# Patient Record
Sex: Male | Born: 1960 | Race: White | Hispanic: Yes | Marital: Married | Smoking: Never smoker
Health system: Southern US, Community
[De-identification: ages and names within clinical notes are randomized; demographics above are authoritative.]

## PROBLEM LIST (undated history)

## (undated) DIAGNOSIS — Z9889 Other specified postprocedural states: Secondary | ICD-10-CM

## (undated) DIAGNOSIS — E785 Hyperlipidemia, unspecified: Secondary | ICD-10-CM

## (undated) DIAGNOSIS — I10 Essential (primary) hypertension: Secondary | ICD-10-CM

## (undated) DIAGNOSIS — K219 Gastro-esophageal reflux disease without esophagitis: Secondary | ICD-10-CM

## (undated) HISTORY — DX: Hyperlipidemia, unspecified: E78.5

## (undated) HISTORY — PX: ESOPHAGEAL DILATION: SHX303

---

## 2007-01-02 ENCOUNTER — Encounter: Admission: RE | Admit: 2007-01-02 | Discharge: 2007-01-02 | Payer: Self-pay | Admitting: Gastroenterology

## 2007-02-10 ENCOUNTER — Observation Stay (HOSPITAL_COMMUNITY): Admission: EM | Admit: 2007-02-10 | Discharge: 2007-02-11 | Payer: Self-pay | Admitting: Emergency Medicine

## 2007-02-19 ENCOUNTER — Ambulatory Visit: Payer: Self-pay | Admitting: Gastroenterology

## 2007-02-26 ENCOUNTER — Ambulatory Visit (HOSPITAL_COMMUNITY): Admission: RE | Admit: 2007-02-26 | Discharge: 2007-02-26 | Payer: Self-pay | Admitting: Gastroenterology

## 2007-04-11 ENCOUNTER — Observation Stay (HOSPITAL_COMMUNITY): Admission: RE | Admit: 2007-04-11 | Discharge: 2007-04-13 | Payer: Self-pay | Admitting: Surgery

## 2007-04-11 DIAGNOSIS — K220 Achalasia of cardia: Secondary | ICD-10-CM

## 2007-04-11 HISTORY — DX: Achalasia of cardia: K22.0

## 2007-04-11 HISTORY — PX: LAPAROSCOPIC HELLER MYOTOMY: SUR780

## 2007-04-22 ENCOUNTER — Ambulatory Visit (HOSPITAL_COMMUNITY): Admission: RE | Admit: 2007-04-22 | Discharge: 2007-04-22 | Payer: Self-pay | Admitting: General Surgery

## 2007-05-23 ENCOUNTER — Ambulatory Visit (HOSPITAL_COMMUNITY): Admission: RE | Admit: 2007-05-23 | Discharge: 2007-05-23 | Payer: Self-pay | Admitting: Surgery

## 2007-10-21 ENCOUNTER — Encounter: Admission: RE | Admit: 2007-10-21 | Discharge: 2007-10-21 | Payer: Self-pay | Admitting: Gastroenterology

## 2008-09-01 ENCOUNTER — Emergency Department (HOSPITAL_COMMUNITY): Admission: EM | Admit: 2008-09-01 | Discharge: 2008-09-01 | Payer: Self-pay | Admitting: Emergency Medicine

## 2010-08-09 LAB — DIFFERENTIAL
Basophils Absolute: 0 10*3/uL (ref 0.0–0.1)
Basophils Relative: 1 % (ref 0–1)
Eosinophils Absolute: 0.3 10*3/uL (ref 0.0–0.7)
Eosinophils Relative: 5 % (ref 0–5)
Lymphocytes Relative: 23 % (ref 12–46)
Lymphs Abs: 1.7 10*3/uL (ref 0.7–4.0)
Monocytes Absolute: 0.7 10*3/uL (ref 0.1–1.0)
Monocytes Relative: 9 % (ref 3–12)
Neutro Abs: 4.7 10*3/uL (ref 1.7–7.7)
Neutrophils Relative %: 63 % (ref 43–77)

## 2010-08-09 LAB — CBC
HCT: 46.5 % (ref 39.0–52.0)
Hemoglobin: 16 g/dL (ref 13.0–17.0)
MCHC: 34.5 g/dL (ref 30.0–36.0)
MCV: 92.1 fL (ref 78.0–100.0)
Platelets: 232 10*3/uL (ref 150–400)
RBC: 5.05 MIL/uL (ref 4.22–5.81)
RDW: 13 % (ref 11.5–15.5)
WBC: 7.5 10*3/uL (ref 4.0–10.5)

## 2010-08-09 LAB — COMPREHENSIVE METABOLIC PANEL
ALT: 27 U/L (ref 0–53)
AST: 27 U/L (ref 0–37)
Albumin: 3.9 g/dL (ref 3.5–5.2)
Alkaline Phosphatase: 74 U/L (ref 39–117)
BUN: 18 mg/dL (ref 6–23)
CO2: 25 mEq/L (ref 19–32)
Calcium: 9.3 mg/dL (ref 8.4–10.5)
Chloride: 105 mEq/L (ref 96–112)
Creatinine, Ser: 1.02 mg/dL (ref 0.4–1.5)
GFR calc Af Amer: >60 mL/min (ref >60)
GFR calc non Af Amer: >60 mL/min (ref >60)
Glucose, Bld: 128 mg/dL — ABNORMAL HIGH (ref 70–99)
Potassium: 3.6 mEq/L (ref 3.5–5.1)
Sodium: 136 mEq/L (ref 135–145)
Total Bilirubin: 0.8 mg/dL (ref 0.3–1.2)
Total Protein: 6.8 g/dL (ref 6.0–8.3)

## 2010-08-09 LAB — URINALYSIS, ROUTINE W REFLEX MICROSCOPIC
Bilirubin Urine: NEGATIVE
Glucose, UA: NEGATIVE mg/dL
Hgb urine dipstick: NEGATIVE
Ketones, ur: NEGATIVE mg/dL
Nitrite: NEGATIVE
Protein, ur: NEGATIVE mg/dL
Specific Gravity, Urine: 1.023 (ref 1.005–1.030)
Urobilinogen, UA: 1 mg/dL (ref 0.0–1.0)
pH: 6.5 (ref 5.0–8.0)

## 2010-08-27 ENCOUNTER — Emergency Department (INDEPENDENT_AMBULATORY_CARE_PROVIDER_SITE_OTHER): Payer: 59

## 2010-08-27 ENCOUNTER — Emergency Department (HOSPITAL_BASED_OUTPATIENT_CLINIC_OR_DEPARTMENT_OTHER)
Admission: EM | Admit: 2010-08-27 | Discharge: 2010-08-27 | Disposition: A | Discharge status: Nursing Home (Includes ICF) | Payer: Self-pay | Attending: Emergency Medicine | Admitting: Emergency Medicine

## 2010-08-27 ENCOUNTER — Inpatient Hospital Stay (HOSPITAL_COMMUNITY)
Admission: RE | Admit: 2010-08-27 | Discharge: 2010-08-29 | DRG: 313 | Disposition: A | Discharge status: Home / Self Care | Payer: Self-pay | Source: Other Acute Inpatient Hospital | Attending: Internal Medicine | Admitting: Internal Medicine

## 2010-08-27 DIAGNOSIS — K220 Achalasia of cardia: Secondary | ICD-10-CM | POA: Diagnosis present

## 2010-08-27 DIAGNOSIS — I10 Essential (primary) hypertension: Secondary | ICD-10-CM | POA: Diagnosis present

## 2010-08-27 DIAGNOSIS — R0789 Other chest pain: Principal | ICD-10-CM | POA: Diagnosis present

## 2010-08-27 DIAGNOSIS — K219 Gastro-esophageal reflux disease without esophagitis: Secondary | ICD-10-CM | POA: Insufficient documentation

## 2010-08-27 DIAGNOSIS — R079 Chest pain, unspecified: Secondary | ICD-10-CM

## 2010-08-27 LAB — DIFFERENTIAL
Basophils Absolute: 0.1 10*3/uL (ref 0.0–0.1)
Basophils Relative: 1 % (ref 0–1)
Lymphocytes Relative: 21 % (ref 12–46)
Monocytes Absolute: 0.8 10*3/uL (ref 0.1–1.0)

## 2010-08-27 LAB — CBC
Hemoglobin: 16 g/dL (ref 13.0–17.0)
MCH: 31.2 pg (ref 26.0–34.0)
MCHC: 36.4 g/dL — ABNORMAL HIGH (ref 30.0–36.0)
MCV: 85.8 fL (ref 78.0–100.0)
RBC: 5.13 MIL/uL (ref 4.22–5.81)
RDW: 12.8 % (ref 11.5–15.5)

## 2010-08-27 LAB — POCT CARDIAC MARKERS
CKMB, poc: 5.1 ng/mL (ref 1.0–8.0)
CKMB, poc: 4 ng/mL (ref 1.0–8.0)
Myoglobin, poc: 144 ng/mL (ref 12–200)
Myoglobin, poc: 115 ng/mL (ref 12–200)

## 2010-08-27 LAB — BASIC METABOLIC PANEL
Chloride: 103 mEq/L (ref 96–112)
GFR calc Af Amer: >60 mL/min (ref >60)
GFR calc non Af Amer: >60 mL/min (ref >60)
Glucose, Bld: 94 mg/dL (ref 70–99)
Potassium: 3.7 mEq/L (ref 3.5–5.1)

## 2010-08-27 LAB — D-DIMER, QUANTITATIVE: D-Dimer, Quant: <0.22 ug/mL-FEU (ref 0.00–0.48)

## 2010-08-28 DIAGNOSIS — R072 Precordial pain: Secondary | ICD-10-CM

## 2010-08-28 LAB — LIPID PANEL
Cholesterol: 147 mg/dL (ref 0–200)
HDL: 27 mg/dL — ABNORMAL LOW (ref >39)
LDL Cholesterol: 89 mg/dL (ref 0–99)
Total CHOL/HDL Ratio: 5.4 RATIO
Triglycerides: 154 mg/dL — ABNORMAL HIGH (ref <150)
VLDL: 31 mg/dL (ref 0–40)

## 2010-08-28 LAB — BASIC METABOLIC PANEL
BUN: 12 mg/dL (ref 6–23)
CO2: 27 mEq/L (ref 19–32)
Calcium: 9.1 mg/dL (ref 8.4–10.5)
Chloride: 105 mEq/L (ref 96–112)
Creatinine, Ser: 1.21 mg/dL (ref 0.4–1.5)
GFR calc Af Amer: >60 mL/min (ref >60)
GFR calc non Af Amer: >60 mL/min (ref >60)
Glucose, Bld: 95 mg/dL (ref 70–99)
Potassium: 3.4 mEq/L — ABNORMAL LOW (ref 3.5–5.1)
Sodium: 137 mEq/L (ref 135–145)

## 2010-08-28 LAB — CARDIAC PANEL(CRET KIN+CKTOT+MB+TROPI)
CK, MB: 6.5 ng/mL (ref 0.3–4.0)
CK, MB: 5 ng/mL — ABNORMAL HIGH (ref 0.3–4.0)
CK, MB: 4.1 ng/mL — ABNORMAL HIGH (ref 0.3–4.0)
Relative Index: 0.9 (ref 0.0–2.5)
Relative Index: 0.8 (ref 0.0–2.5)
Relative Index: 0.8 (ref 0.0–2.5)
Total CK: 738 U/L — ABNORMAL HIGH (ref 7–232)
Total CK: 612 U/L — ABNORMAL HIGH (ref 7–232)
Total CK: 519 U/L — ABNORMAL HIGH (ref 7–232)
Troponin I: 0.02 ng/mL (ref 0.00–0.06)
Troponin I: 0.02 ng/mL (ref 0.00–0.06)
Troponin I: 0.02 ng/mL (ref 0.00–0.06)

## 2010-08-28 LAB — D-DIMER, QUANTITATIVE: D-Dimer, Quant: <0.22 ug/mL-FEU (ref 0.00–0.48)

## 2010-08-28 LAB — AMYLASE: Amylase: 71 U/L (ref 0–105)

## 2010-08-28 LAB — LIPASE, BLOOD: Lipase: 45 U/L (ref 11–59)

## 2010-08-28 LAB — RAPID URINE DRUG SCREEN, HOSP PERFORMED: Cocaine: NOT DETECTED

## 2010-08-28 LAB — AST: AST: 41 U/L — ABNORMAL HIGH (ref 0–37)

## 2010-08-28 LAB — ALT: ALT: 32 U/L (ref 0–53)

## 2010-09-01 NOTE — Discharge Summary (Signed)
[name], [name]:  [id number]  MEDICAL RECORD NO.:  [id number]           PATIENT TYPE:  I  LOCATION:  3733                         FACILITY:  [facility]  PHYSICIAN:  Jeoffrey Massed, MD    DATE OF BIRTH:  02/09/1961  DATE OF ADMISSION:  08/27/2010 DATE OF DISCHARGE:                        DISCHARGE SUMMARY - REFERRING   PRIMARY CARE PRACTITIONER:  Will now be at the [facility].  PRIMARY DISCHARGE DIAGNOSIS:  Atypical chest pain.  SECONDARY DISCHARGE DIAGNOSES: 1. History of gastroesophageal reflux disease. 2. Hypertension. 3. Achalasia.  DISCHARGE MEDICATIONS: 1. Aspirin 81 mg 1 tablet daily. 2. Hydrochlorothiazide 12.5 mg 1 capsule p.o. daily. 3. Multivitamins 1 tablet p.o. daily. 4. Omeprazole 20 mg 1 capsule p.o. daily.  CONSULTATIONS:  None.  BRIEF HISTORY OF PRESENT ILLNESS:  The patient is a 50 year old Hispanic male with the above-noted medical problems who was brought into the hospital on April 28 with predominant left-sided along with epigastric pain.  He was then admitted to the hospitalist service for further evaluation and treatment.  For further details, please see the history and physical that was dictated by Dr. Wolfgang Phoenix on admission.  A 2-D echocardiogram done on 28 August 2010 showed an EF around 60-65%. Systolic function was normal.  PERTINENT LABORATORY DATA: 1. Troponins were cycled, and these were negative. 2. TSH is 2.505. 3. D-dimers was less than 0.22. 4. Urine drug screen was negative. 5. Amylase was 71. 6. Lipase was 45. 7. Urine drug screen was negative as well. 8. The patient's peak CK was 738 and last CK was 519.  PERTINENT RADIOLOGICAL STUDIES: 1. Portable chest x-ray shows no acute cardiopulmonary findings. 2. Chest pain.  The patient was admitted with left-sided as well as     substernal epigastric pain.  The patient had a history of     hypertension.  He has no other risk factors apart from this.   He     was admitted.  Cardiac enzymes were cycled.  Although he had CK and     CK-MB elevation, his troponins were negative.  At this time, this     chest pain is felt to be atypical.  The patient has a history of     achalasia and is actually status post partial posterior     fundoplication and also has a history of balloon dilatation done in     2009 and his pain is probably felt to be secondary to GI issues.     In any event, a 2-D echocardiogram was done which is essentially     normal.  [facility] has been contacted, and we will contact     the patient for an outpatient stress test. 3. Gastroesophageal reflux disease.  He is to continue taking     omeprazole. 4. Hypertension.  This is stable.  He is to continue taking     hydrochlorothiazide.  DISPOSITION:  The patient will be discharged home.  FOLLOW-UP INSTRUCTIONS: 1. The patient does not have a primary care practitioner and claims     that he will follow up at the [facility].  Contact  information including telephone number has been given to the     patient. 2. [facility] will contact the patient for an outpatient     stress test.  Total time spent 45 minutes.     Jeoffrey Massed, MD     SG/MEDQ  D:  08/29/2010  T:  08/29/2010  Job:  [id number]  cc:   [facility]  Electronically Signed by Jeoffrey Massed  on 09/01/2010 03:41:56 PM

## 2010-09-03 LAB — CULTURE, BLOOD (ROUTINE X 2)
Culture  Setup Time: 201204291143
Culture  Setup Time: 201204291143
Culture: NO GROWTH
Culture: NO GROWTH

## 2010-09-05 ENCOUNTER — Encounter: Payer: Self-pay | Admitting: Internal Medicine

## 2010-09-13 NOTE — Op Note (Signed)
NAMEJERRITT, CARDOZA NO.:  [id number]   MEDICAL RECORD NO.:  [id number]          PATIENT TYPE:  AMB   LOCATION:  ENDO                         FACILITY:  [facility]   PHYSICIAN:  Ardeth Sportsman, MD     DATE OF BIRTH:  04-17-1961   DATE OF PROCEDURE:  05/23/2007  DATE OF DISCHARGE:                               OPERATIVE REPORT   PRIMARY CARE PHYSICIAN:  Fleet Contras, MD.   GASTROENTEROLOGIST:  Jeani Hawking, MD.   SURGEON:  Karie Soda, MD.   PREOPERATIVE DIAGNOSES:  1. 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  2. Partial posterior fundoplication with persistent dysphagia.   POSTOPERATIVE DIAGNOSES:  1. Achalasia 6 weeks status post cardiomyopathy.  2. Mild gastritis.  3. Retained food in stomach consistent with delayed gastric emptying.  4. Mild tightness in distal esophagus but no definite stricture.   PROCEDURE PERFORMED:  1. Esophagogastroduodenoscopy.  2. Balloon dilatation from 15 to 18 mm (up to 54 [place]) x2 minutes.  3. Injection of Kenalog-40 x4 quadrants in distal esophagus.   ANESTHESIA:  1. Intermittent fentanyl and midazolam.  2. Cetacaine oral spray.   SPECIMENS:  None.   DRAINS:  None.   ESTIMATED BLOOD LOSS:  Less than 5 mL.   COMPLICATIONS:  None apparent.   INDICATIONS:  Senor Wandel is a 50 year old male with classic symptoms  and diagnosis of achalasia.  He is 6 weeks status post laparoscopic  cardiomyopathy and partial fundoplication but he had persistent  dysphagia.  His initial swallow study was moderately tight but it seemed  to improve.  I placed him on some oral steroids.  He was still  complaining of persistent dysphagia beyond pureed foods.  The patient  had anxiety.  A recommendation made for diagnostic endoscopy with  probable dilatation and injection of steroids to see if that would  improve things.   Risks such as stroke, heart attack, deep venous thrombosis, pulmonary  embolism and death were discussed.  Risks such as perforation, need for  surgery, wound infection, abscess, injury to other organs, prolonged  pain, persistent dysphagia, and other risks were discussed as well.  Questions were answered & he agreed to proceed.   OPERATIVE FINDINGS:  He had a dilated esophagus but no retained food  with only minimal froth.  From 42 to 44 cm was snug but I will not say  severely tight.  There was no definite esophageal stricture or mass.  He  had a large amount of retained food in his antrum with some mild  gastritis.  Pylorus and duodenum appeared normal.   DESCRIPTION OF PROCEDURE:  Informed consent was confirmed.  Patient  received oral Cetacaine and was positioned left lateral decubitus.  He  had intermittent midazolam and fentanyl for a total of 150 mcg of  fentanyl and 6 mg of Versed.  An oral bite block was placed.  Endoscope  was able to be passed into the oropharynx and hypopharynx which appeared  to be normal.  Scope passed down with voluntary swallow into a  moderately dilated esophagus but no significant esophagitis.  Encountered down at about 42 cm had a tighter area but not significant  stricture.  Encountered a mild dilated segment around 2 cm and then a  high pressure zone at 44 cm.  I immediately encountered a large volume  of retained food within the stomach.  There is some mild diffuse  gastritis.  The pylorus appeared to be normal.   Retroflexion view revealed, I would say, about a type 3 valve consistent  with partial fundoplication.  It did not seem particularly scarred or  that abnormal.  It was a little challenging to see since he had a lot of  retained food that persisted despite numerous irrigation and aspiration  attempts.   Scope was placed straight and a balloon dilator was advanced.  Scope was  brought back down to the level of the LES and the balloon was dilated to  15 mm x30 seconds, then 16.5 mm x30 seconds, and then 18  mm for about a  minute and a half.  At that point the balloon migrated distally in the  stomach so we repositioned and again did the same procedure, dilated at  18mm for 2 minutes.  The balloon was brought down.  He tolerated the  procedure well.  He was not tachycardic.  I went ahead and used a  sclerotherapy needle to inject a total of 4 mL of Kenalog 40 using 1 mL  injections in four quadrants down at the level of 44 cm.  The scope was  readvanced, and the stomach and wrap was reinspected.  There was no  evidence of any bleeding or abnormalities.  Excess gas was aspirated,  the scope was aspirated upon withdrawal.   Postoperatively, he was complaining of some mild chest discomfort but no  severe pain.  We obtained an upper GI Gastrografin that showed no leak  and pretty good movement of contrast across the wrap.  I discussed the  findings with his wife in detail.  Questions were answered & she  expressed understanding & appreciation      Ardeth Sportsman, MD  Electronically Signed     SCG/MEDQ  D:  05/23/2007  T:  05/23/2007  Job:  [id number]

## 2010-09-13 NOTE — H&P (Signed)
[name], [name]:  [id number]   MEDICAL RECORD NO.:  [id number]          PATIENT TYPE:  EMS   LOCATION:  MAJO                         FACILITY:  [facility]   PHYSICIAN:  Herbie Saxon, MDDATE OF BIRTH:  1961/02/06   DATE OF ADMISSION:  02/10/2007  DATE OF DISCHARGE:                              HISTORY & PHYSICAL   PRIMARY CARE PHYSICIAN:  Unassigned   GASTROENTEROLOGIST:  Dr. Elnoria Howard   PRESENTING COMPLAINT:  Chest pain one-day duration.   HISTORY OF PRESENTING COMPLAINT:  This is a 50 year old Hispanic male  who was diagnosed with hypertension four months ago.  He has a history  of achalasia, gastroesophageal reflux disease and has been seen as an  outpatient by Dr. Elnoria Howard.  Patient is complaining of sudden-onset chest  pain which woke him up from sleep at 10:00 p.m. last night.  The chest  pain is 7 to 8/10, pressure like, radiating to the back, aggravated by  food, relieved by lying still.  No shortness of breath, no palpitations,  no pedal swelling.  He has dizziness.  No diaphoresis, no syncopal  episode.  He has nausea plus two episodes of vomiting.  No frank  hematemesis, but he notices on-and-off hematochezia the last three days.  No diarrhea, no constipation, no jaundice.  There is no fever or skin  rash.  No joint swelling.   PAST SURGICAL HISTORY:  Nil of note.   PAST MEDICAL HISTORY:  1. Hypertension.  2. GERD.  3. Achalasia.   SOCIAL HISTORY:  Patient is married, has four children, lives at home.  Past history of cocaine, alcohol abuse; he says he quit about several  years ago drinking alcohol and he quit doing cocaine about 15 years ago;  he quit smoking one pack per day a year ago; he used to smoke for about  eight years.   FAMILY HISTORY:  Cousin cancer of the stomach; grandfather stroke and  cancer of the face.   No known drug allergies.   MEDICATIONS:  Lisinopril one tablet daily, Prevacid one tablet daily;  doses  unknown.   REVIEW OF SYSTEMS:  The 12-systems reviewed, pertinent positives as  above.   PHYSICAL EXAMINATION:  GENERAL:  He is a middle-age man not in acute  respiratory distress.  VITAL SIGNS:  Temperature 97, pulse 71, respiratory rate 18, blood  pressure 106/66.  HEENT:  Pupils equal and reactive to light and accommodation.  Neck is  supple.  Extraocular muscles are intact.  Head is atraumatic,  normocephalic.  Mucous membranes are moist.  There is no elevated JVD or  carotid bruit.  No thyromegaly.  No submandibular lymph nodes.  CHEST:  Clinically clear.  Reduced breath sounds at the bases.  ABDOMEN:  Mild epigastric tenderness.  There is no organomegaly.  No  rebound.  No guarding.  Bowel sounds are normoactive.  Inguinal orifices are intact.  Peripheral pulses present.  No pedal  edema.  NEURO:  He is alert and oriented x3.  Power is 5 globally.  Cranial  nerves and sensory system normal.   The lab  tests show a WBC of 8.8, hematocrit 40, platelet count is 271.  The troponin is less than 0.05.  CK-MB 1.5  Chemistry shows a sodium of  135, potassium 3.4, chloride 102, bicarbonate 27, glucose 108, BUN 13,  creatinine 1.1.  AST is 22, ALT is 22.  Lipase 43.   Chest x-ray shows bronchitic changes with minimal bibasal atelectasis.   No ST-T abnormalities on EKG.   ASSESSMENT:  1. Chest pain.  2. Rule out gastrointestinal bleed.  3. History of achalasia.  4. Gastroesophageal reflux disease.  5. Hypertension, stable.  6. Mild hypokalemia.   Patient is to be admitted to a telemetry bed and be kept n.p.o. until  gastroenterology evaluate.  I will cycle his cardiac enzymes q.8 hours  x3 with EKG q.8 hours x3, place him on Protonix 40 mg IV q.12 hourly, CP  boots for DVT prophylaxis, Phenergan 12.5 mg IV q.8 hours p.r.n.,  nitroglycerin 0.4 mg sublingual p.r.n., Dilaudid 0.5 to 1 mg IV q.4  hours p.r.n., morphine 2 mg IV q.6 hours p.r.n., O2 two liters nasal  cannula to keep  pulse ox greater than 90.  Orthostatic blood pressure  checked q.6 hourly and monitor the H&H q.8 hourly for the first 24 hours  and reevaluate.  Hemoccult x3.  Type and cross, match four units of  packed red blood cells and if hematocrit drops below 25, transfuse two  units packed red blood cells with 40 mg IV Lasix or lisinopril 2.5 mg  daily, hold if blood pressure is less than 100/60, albuterol and  Atrovent one unit dose q.6 hours p.r.n.  Get D-dimer review.  Hold  aspirin until Hemoccult is checked to be negative x3.  Gastroenterology  evaluation in the morning, possibly repeat endoscopy.  Get a 2-D  echocardiogram given he has not had one in the last six months.  He is a  full code.  I will place him on Reglan 10 mg IV q.6 hours p.r.n. for  nausea.      Herbie Saxon, MD  Electronically Signed     MIO/MEDQ  D:  02/10/2007  T:  02/10/2007  Job:  [id number]

## 2010-09-13 NOTE — Op Note (Signed)
[name]:  [id number]   MEDICAL RECORD NO.:  [id number]          PATIENT TYPE:  INP   LOCATION:  1532                         FACILITY:  [facility]   PHYSICIAN:  Bradley Sportsman, MD     DATE OF BIRTH:  November 11, 1960   DATE OF PROCEDURE:  04/11/2007  DATE OF DISCHARGE:                               OPERATIVE REPORT   SURGEON:  Bradley Sportsman, MD.   ASSISTANT:  [name], M.D.   PRIMARY CARE PHYSICIAN:  [name], M.D.   GASTROENTEROLOGIST:  [name]. Bradley Howard, MD.   PREOPERATIVE DIAGNOSIS:  Achalasia.   POSTOPERATIVE DIAGNOSIS:  Achalasia.   PROCEDURES PERFORMED:  1. Laparooscopic Heller esophagocardiomyotomy.  2. Laparoscopic Toupet (270 degree, partial posterior) fundoplication.   ANESTHESIA:  1. General anesthesia.  2. Local anesthetic with field block around all port sites.   SPECIMENS:  None.   DRAINS:  A 15 [place] Blake drain rests in the splenic fossa and over the  myotomy.   ESTIMATED BLOOD LOSS:  100 mL.   COMPLICATIONS:  Small anterior tear at the esophagogastric junction  repaired with 3-0 PDS stitch, Tisseel and drain.   INDICATIONS FOR PROCEDURE:  [name] is a 50 year old gentleman who  has noted worsening episodes of dysphagia for the past six years and he  has undergone evaluation by Dr. Jeani Hawking who performed endoscopy  which was suspicious for achalasia but no evidence of stricture.  He  also had an esophagogram which was kind of suspicious for this as well.  He underwent esophageal manometry which showed noted aperistalsis of  contractions and amplitudes in the 12-20 mm range which is far below the  normal range of 30-108.  This work-up was consistent with achalasia.  Options were discussed and recommendation was made for a laparoscopic  myotomy with partial fundoplication.   TECHNIQUE:  Laparoscopic esophagocardiomyotomy (Heller myotomy) with  partial posterior fundoplication was discussed.  Risks  such as stroke,  heart attack, deep venous thrombosis, pulmonary embolism and death were  discussed.  Risks such as bleeding, need for transfusion, wound  infection, abscess, injury to other organs and prolonged pain, leak  requiring reoperation, prolonged drainage or TPN and other risks were  discussed.  Questions answered and she agreed to proceed.   FINDINGS:  He had no evidence of any paraesophageal hernia.  He had a  strictured distal esophagus with tight bands consistent with achalasia  with proximal esophageal dilation.  The esophageal manometry went  smoothly but during a transition over to the esophagogastric junction,  there was a tear about 6 mm in size that was apparent initially and then  opened up again to around 15 mm in size and was repaired with 3-0 PDS  interrupted stitch to good result and then patched with Tisseel.  See  below.   DESCRIPTION OF PROCEDURE:  Informed consent was confirmed.  Patient had  received IV antibiotics prior to surgery.  He had sequential compression  devices active during the entire case.  He was positioned supine on a  split leg table.  He underwent  general anesthesia without any  difficulty.  A Foley catheter was sterilely placed.  His abdomen was  prepped and draped in sterile fashion.  Entering in the abdomen, the  patient in steep reverse Trendelenburg and left side up, we placed a 5  mm port in the left upper quadrant and paramedian region half way  between the umbilicus and the xiphoid in the medial clavicular line  using optical entry technique to place a 5 mm port.  Capnoperitoneum 15  mmHg provided good abdominal insufflation.  Under direct visualization a  5 mm port was placed in the right upper quadrant and a 10 mm port was  placed in the left subcostal region and a 5 mm port was placed in the  right subxiphoid region.  Another 5 mm port was placed in the left  subxiphoid region, removed and the Nathanson rigid liver retractor  was  passed under direct visualization and used to lift the thick lateral  sector of the liver anteriorly.   The hepatogastric ligament was identified with the stomach on axial  tension.  This was ligated using ultrasonic dissection.  This helped to  identify the right crus.  Window was made between the right crus and the  esophagus anteriorly.  However, posteriorly the tissues were a little  bit fibrosed.  Therefore, I decided to take approach from the left side.   The short gastrics were ligated about a third of the way down the  greater curvature and followed cephalad over the proximal greater  curvature and the cardia by flipping the posterior part of the stomach  anteriorly and towards the right and holding the greater omentum towards  the left using ultrasonic dissection.  There were some close attachments  to the spleen but these were carefully taken out.  I would say the  superior third of the spleen became ischemic, consistent with probable  segmental preferential blood supply from the short gastrics.  With  further rolling the stomach over, I could identify the left crus and the  window was made between the esophagus and the medial border of the left  crus.  the phrenoesophageal attachments on the left side were freed off.  Further dissection was done anteriorly to the anterior part of the crus  to help meet up with the right anterior part of the crus.  Then we  created a window posteriorly as well.  The stomach and esophagus were  reflected towards the left to better expose the posterior mediastinum  from the right side.  With this, I was able to free off the right crus  off the right front esophageal attachments off the medical right crus,  carefully using blunt and controlled Harmonic dissection.   Type 2 medial dissection was done to help get enough intra-abdominal  esophageal length and come up to go up above the obviously narrowed  distal esophagus to a more dilated  esophagus more proximally up in the  mediastinum.   The anterior epiphrenic pad was free off anteriorly, taking care to  avoid injury to the anterior vagus nerve.  Note that the anterior and  posterior vagus nerves were identified and preserved during the entire  case.  The anterior vagus nerve along with the phrenic pad was elevated  off the esophagus and reflected towards the right and was freed off the  esophagus and mobilized off the anterior esophagus about 10 cm  proximally.   Attention was turned toward the myotomy.  Myotomy was started at  the  distal esophagus bluntly going through the longitudinal fibers and  identified and ligating the circular fibers of the esophagus using cold  scissors.  Some careful blunt dissection was done using the sucker as  well as [place] to help free the fibers off the mucosa and take them  with sharp scissors.  Myotomy was carried up about 7 cm proximally from  the esophagogastric junction to a more dilated esophagus and thinner  muscle layers.  Dissection was carried down onto the stomach, however,  in doing this, there was a very fibrotic region in the esophagogastric  junction.  In trying to carefully free this area, got into a bloody  region.  Focused point cautery helped provide good hemostasis.  Sharp  scissors helped to get through some of the layers.  Carefully blunt  dissection was used to try and free the tissues of the mucosa but in  doing this, there was a tear of the mucosa.  It was immediately  identified and controlled using 3-0 PDS interrupted stitches x2.  Further myotomy was attempted but in trying to free off, there was a  tear continued a little more cephalad to where the prior repair was.  This was controlled using 3-0 PDS running stitch to good result.   Based on this tear, I decided to redirect the myotomy more laterally on  the stomach so a site was chosen 2 cm more lateral a little closer to  the angle of His.  I was  able to sharply get in through the stomach's  oblique muscular fibrous layers to see the layer of the gastric mucosa  and this was preserved.  Myotomy was carried up more cephalad.  In doing  this, I felt I got about 2.5 cm from the esophagogastric junction onto  the stomach for an adequate myotomy on the cardia of the stomach.  Copious irrigation and meticulous inspection was done for good  hemostasis and there was no evidence of any new tears.  The repairs were  intact.   Attention was turned towards fundoplication.  The cardia of the stomach  was brought through the retroesophageal window and brought over on the  right side to help develop the right side of the wrap.  The remaining  left side of the stomach most cephalad greater curvature was held and a  classic shoeshine maneuver was used to make sure the wrap laid well with  no twisting or turning.  The right side of the wrap was grasped and  rotated anteriorly towards the spleen to expose the posterior esophageal  hiatus. The esophageal hiatus was closed down using 0 Ethibond  interrupted stitches, incorporating a bite of the posterior wall of the  wrap, therefore doing a posterior gastropexy x3.  This provided good  reclosing of the esophageal hiatus but avoiding over-tightening.   Interrupted esophagogastric stitches x6 were used, three on each side,  to create the partial posterior wrap.  Stitches were placed three on the  right and three on the left in a mirror image fashion.  Stitches were  placed such that the most cephalad stitch took a bite of the wrap on the  most superior aspect of the wrap at the short gastrics, taking good bite  of the esophageal muscle at the lateral edge of the myotomy, and also  taking a bite of the peritoneal fascial covering at the superior aspect  of the left crus and tied down to create the left most superior stitch.  A similar stitch was placed on the right in a mirror image fashion.  This  provided anterior gastropexies in both sides.  Further interrupted  stitches were carried down more distally onto the esophagus so that  there was a 4.5 cm partial posterior fundoplication wrap.  The stitches  held the edges of the myotomy open so the mucosa lay wide open.  Inspection was done and hemostasis was excellent.  Again, the repairs  were intact and there was no evidence of any other injury.  Copious  irrigation over 2 liters was done with clear return and there was no  evidence of injury or bleeding elsewhere.  Tisseel was placed over the  myotomy and focusing around the repair of the small esophagogastric  tears for a nice thick layer and covering.  A 15 [place] Blake drain was  passed through the right upper quadrant port site with the tip down in  the splenic fossa and coming up over the left side of the wrap, laying  over the anterior part of the myotomy, and coming out underneath the  left lateral sector of the liver over to the right port.  It was secured  to the skin using 2-0 nylon stitch.  Nathanson liver retractor was  removed after direct visualization.  There was no evidence of any injury  to other organs.  The capnoperitoneum was evacuated and ports were  removed.  The left subcostal 5 mm port had fascia slid above the rib and  did not require any more aggressive closure.  Skin was closed using 4-0  Monocryl stitch.  Sterile dressings were applied.  Patient was extubated  and taken to the recovery room in stable condition.   I am about to explain the operative findings to the patient's family.  Postoperative instructions have been discussed with the patient and he  has our postoperative dysphagia diet.  Will do a follow-up swallow study  in the morning and check drain for amylase in the morning to make sure  there is no evidence of postoperative leak.      Bradley Sportsman, MD  Electronically Signed     SCG/MEDQ  D:  04/11/2007  T:  04/12/2007  Job:   [id number]   cc:   [name]. Bradley Howard, MD  Fax: [phone]   [name], M.D.  Fax: [phone]

## 2010-09-13 NOTE — Discharge Summary (Signed)
[name], [name] NO.:  [id number]   MEDICAL RECORD NO.:  [id number]          PATIENT TYPE:  INP   LOCATION:  1532                         FACILITY:  [facility]   PHYSICIAN:  Ardeth Sportsman, MD     DATE OF BIRTH:  05/03/1960   DATE OF ADMISSION:  04/11/2007  DATE OF DISCHARGE:  04/13/2007                               DISCHARGE SUMMARY   PRIMARY CARE PHYSICIAN:  Fleet Contras, M.D.   GASTROENTEROLOGIST:  Jordan Hawks. Elnoria Howard, MD   FINAL DISCHARGE DIAGNOSIS:  Achalasia.   SECONDARY DISCHARGE DIAGNOSES:  1. Hypertension.  2. Pseudo reflux.   PROCEDURES:  Laparoscopic Heller esophagocardiomyotomy with Toupet  (partial 270-degree) fundoplication on April 11, 2007.   SUMMARY OF HOSPITAL COURSE:  [name] is a 50 year old  gentleman with achalasia that was documented by endoscopy,  esophagram,  and monometry.  Dr. Elnoria Howard requested surgical consultation, and after  extensive workup and discussion, the patient underwent the above  procedure.  Postoperatively, he had a postoperative swallow study which  showed the wrap was initially tight but no evidence of a leak.  He had  drain amylase sent which was same as serum, arguing against a leak.  He  was started on a liquid diet and was tolerating a full liquid diet the  following day.  We placed him on IV steroids.  Repeat swallowing study  the following day showed that while the wrap was much less tight,  contrast flowing across the wrap much more easily.  The patient denied  any significant dysphagia.   He was walking the hallways and doing well.  Based on these  improvements, it was decided he could be discharged home with the  following instructions.  1. He is to return to the clinic to see me in about 2-3 weeks.  2. Had an extensive discussion on full liquid to puree, to soft food      diet.  The 5-page esophageal diet instruction sheet was given to      the patient.  His wife already has a copy of this at  home as well.  3. He will be discharged on Percocet 1-2 p.o. q. 4 h p.r.n. pain      crushed  4. Reglan p.o. q. 6 h p.r.n. nausea.  5. Phenergan suppository 25 mg P.R. q. 6 h p.r.n. nausea.  6. He also can take ibuprofen and Tylenol, ice packs, and heating pads      for pain control.  7. He should take Lisinopril 25/12.5 q.a.m. for hypertension.  8. Prevacid 30 mg p.o. b.i.d. for now for acid prevention.  He      probably can wean himself off that once he is further out.  9. He has some issues and disability paperwork, and he will have his      work fax [place] the paperwork to be filled out and followed up.  10.He should call if he has fevers, chills, sweats, nausea, vomiting,      uncontrolled pain, or other concerns.      Ardeth Sportsman, MD  Electronically Signed     SCG/MEDQ  D:  04/13/2007  T:  04/14/2007  Job:  [id number]   cc:   Fleet Contras, M.D.  Fax: [phone]   Jordan Hawks. Elnoria Howard, MD  Fax: [phone]

## 2010-09-18 NOTE — H&P (Signed)
[name], [name]                ACCOUNT NO.:  [id number]  MEDICAL RECORD NO.:  [id number]           PATIENT TYPE:  I  LOCATION:  3733                         FACILITY:  [facility]  PHYSICIAN:  Celso Amy, MD   DATE OF BIRTH:  11/03/1960  DATE OF ADMISSION:  08/27/2010 DATE OF DISCHARGE:                             HISTORY & PHYSICAL   CHIEF COMPLAINT:  Chest pain.  HISTORY OF PRESENT ILLNESS:  The patient is a 50 year old Hispanic male with past medical history of hypertension, GERD who presented to the ER with a chief complaint of chest pain.  History of present illness dates back to this morning at 8 a.m. and the patient has had sudden onset of pain left-sided, substernal, lasted 5 minutes.  The patient said earlier the pain was stinging in sensation and later it has changed to pressure like feeling.  At the time of H and P, the patient does not have any chest pain.  The patient was not aware of any exacerbating or relieving factors.  The patient also complained of shortness of breath associated with chest pain.  The patient complained of generalized weakening with chest pain.  The patient complained of dizziness with chest pain.  No complaint of sticking of solid food.  No known complaints of loss of weight.  No complaint of fever, cough or chills.  No complaint of recent travel.  No complaint of loss of bladder or bowel movements.  No complaint of any blood per rectum.  No complaint of melena or hematemesis.  The patient says he does have GERD-like symptoms which caused heartburn and chest pain, but those symptoms are different than what he had this morning.  REVIEW OF SYSTEMS:  Negative besides the HPI.  ALLERGIES:  The patient has no known drug allergies.  SOCIAL HISTORY:  The patient denies smoking or drinking.  The patient denies illegal drug abuse at this time.  PAST MEDICAL HISTORY:  Positive for: 1. Hypertension. 2. GERD. 3. Aclasia.  FAMILY HISTORY:  The  patient's mother has some cardiac problem, but is not sure.  The patient's brother who was present in the room said he does not have any coronary artery disease.  He had angio which showed that he has got some variation of the vision of the arteries, but does not have any obstructions.  MEDICATIONS:  The patient takes hydrochlorothiazide 12.5 mg p.o. daily. One Nexium pill.  The patient was not sure of his doses.  PHYSICAL EXAMINATION:  VITAL SIGNS:  Blood pressure 119/78, pulse 55, respiratory rate 20 and temperature afebrile. GENERAL:  The patient is awake, alert, oriented, lying comfortably in bed in no apparent distress, well-built, well-nourished.  HEENT:  Pupils equally reactive to light and accommodation.  Extraocular movement intact.  Sclerae anicteric. NECK:  Supple. RESPIRATORY:  No acute respiratory distress. CHEST:  Clear to auscultation bilaterally. CARDIOVASCULAR:  S1 and S2 regular in rate and rhythm.  No murmurs were appreciated. GI:  Bowel sounds present. ABDOMEN:  Soft, nontender, nondistended and obese. EXTREMITIES:  No lower extremity edema. CNS:  Cranial nerves II through XII grossly intact.  Strength  5/5 both upper extremity and lower extremity.  LABORATORY DATA:  Sodium 141, potassium 3.7, serum chloride 103, bicarb 26, BUN 15, serum creatinine 1.1, glucose 94, calcium 9.5, patient's WBC 9.8.  The patient's hemoglobin 16, hematocrit 44, platelets 275.  The patient's troponin's are negative at 0.05.  Myoglobin is at 115, D-dimer is less than 0.22.  EKG is normal sinus rhythm.  No ST-T is noted. Chest x-ray had no acute cardiopulmonary findings.  IMPRESSION: 1. Cardiovascular:  The patient is being admitted with chest pain, has     no family history, is nonsmoker.  We will admit to rule out     myocardial infarction.  The patient's differential includes     coronary artery disease versus gastroesophageal reflux disease. 2. Gastrointestinal:  The patient with  a history of gastroesophageal     reflux disease.  The patient with a history of aclasia, but the     patient says that this symptoms are different than the earlier. 3. Hypertension:  The patient with a history of hypertension, but     blood pressure is goal at this time. 4. Deep venous thrombosis.  We will keep the patient on deep venous     thrombosis prophylaxis.  PLAN: 1. We will admit the patient to telemetry. 2. We will cycle trop's. 3. We will check the patient's FOBT as the patient has history of     aclasia and this could be GI symptoms. 4. We will check for AST, ALT and  lipase because of the above. 5. We will check for urine tox screen. 6. We will ask for 2-D echo.  We will start the patient on aspirin for     now.  We will not give Lovenox as the patient TIMI score is low and     trop's are negative.  There is no family history.  There are no EKG     changes.  The patient's further course depends upon how the patient     does during this admission.     Celso Amy, MD     MB/MEDQ  D:  08/27/2010  T:  08/28/2010  Job:  [id number]  Electronically Signed by Celso Amy M.D. on 09/18/2010 02:18:12 PM

## 2011-02-06 LAB — CBC
MCHC: 34.3
MCV: 91.9
Platelets: 294

## 2011-02-06 LAB — BASIC METABOLIC PANEL
BUN: 14
Creatinine, Ser: 1.12
GFR calc non Af Amer: >60

## 2011-02-06 LAB — HEMOGLOBIN AND HEMATOCRIT, BLOOD: Hemoglobin: 14.9

## 2011-02-06 LAB — POTASSIUM: Potassium: 4.2

## 2011-02-09 LAB — CK TOTAL AND CKMB (NOT AT ARMC)
CK, MB: 2.1
CK, MB: 2.8
Relative Index: 0.9
Relative Index: 0.9
Relative Index: 1
Total CK: 199
Total CK: 315 — ABNORMAL HIGH

## 2011-02-09 LAB — COMPREHENSIVE METABOLIC PANEL
AST: 21
AST: 22
Albumin: 3.5
BUN: 11
BUN: 13
CO2: 27
Calcium: 8.5
Calcium: 8.6
Creatinine, Ser: 1.05
Creatinine, Ser: 1.12
GFR calc Af Amer: >60
GFR calc Af Amer: >60
GFR calc non Af Amer: >60
GFR calc non Af Amer: >60
Total Bilirubin: 0.9

## 2011-02-09 LAB — CBC
HCT: 40.1
MCHC: 34.6
MCHC: 34.7
MCV: 91.2
MCV: 92.6
Platelets: 245
Platelets: 271
RBC: 4.42

## 2011-02-09 LAB — POCT CARDIAC MARKERS
CKMB, poc: 1.5
Myoglobin, poc: 86
Operator id: 133351
Troponin i, poc: <0.05

## 2011-02-09 LAB — HEMOGLOBIN AND HEMATOCRIT, BLOOD
HCT: 37.6 — ABNORMAL LOW
HCT: 41.5
Hemoglobin: 12.8 — ABNORMAL LOW
Hemoglobin: 14

## 2011-02-09 LAB — URINALYSIS, ROUTINE W REFLEX MICROSCOPIC
Bilirubin Urine: NEGATIVE
Glucose, UA: NEGATIVE
Hgb urine dipstick: NEGATIVE
Specific Gravity, Urine: 1.01
pH: 6

## 2011-02-09 LAB — CROSSMATCH: Antibody Screen: NEGATIVE

## 2011-02-09 LAB — TROPONIN I: Troponin I: 0.01

## 2011-02-09 LAB — DIFFERENTIAL
Eosinophils Relative: 6 — ABNORMAL HIGH
Lymphocytes Relative: 27
Lymphs Abs: 2.3
Neutro Abs: 5

## 2011-02-09 LAB — PROTIME-INR
INR: 0.9
Prothrombin Time: 12.6

## 2011-02-09 LAB — ABO/RH: ABO/RH(D): A POS

## 2011-02-09 LAB — LIPASE, BLOOD: Lipase: 43

## 2012-07-16 ENCOUNTER — Emergency Department (HOSPITAL_COMMUNITY): Payer: BC Managed Care – PPO

## 2012-07-16 ENCOUNTER — Encounter (HOSPITAL_COMMUNITY): Payer: Self-pay | Admitting: *Deleted

## 2012-07-16 ENCOUNTER — Emergency Department (HOSPITAL_COMMUNITY)
Admission: EM | Admit: 2012-07-16 | Discharge: 2012-07-16 | Disposition: A | Discharge status: Home / Self Care | Payer: BC Managed Care – PPO | Attending: Emergency Medicine | Admitting: Emergency Medicine

## 2012-07-16 DIAGNOSIS — R350 Frequency of micturition: Secondary | ICD-10-CM | POA: Insufficient documentation

## 2012-07-16 DIAGNOSIS — R109 Unspecified abdominal pain: Secondary | ICD-10-CM | POA: Insufficient documentation

## 2012-07-16 DIAGNOSIS — R51 Headache: Secondary | ICD-10-CM | POA: Insufficient documentation

## 2012-07-16 DIAGNOSIS — I10 Essential (primary) hypertension: Secondary | ICD-10-CM | POA: Insufficient documentation

## 2012-07-16 DIAGNOSIS — N419 Inflammatory disease of prostate, unspecified: Secondary | ICD-10-CM | POA: Insufficient documentation

## 2012-07-16 DIAGNOSIS — Z8719 Personal history of other diseases of the digestive system: Secondary | ICD-10-CM | POA: Insufficient documentation

## 2012-07-16 DIAGNOSIS — R300 Dysuria: Secondary | ICD-10-CM | POA: Insufficient documentation

## 2012-07-16 DIAGNOSIS — N509 Disorder of male genital organs, unspecified: Secondary | ICD-10-CM | POA: Insufficient documentation

## 2012-07-16 DIAGNOSIS — R3915 Urgency of urination: Secondary | ICD-10-CM | POA: Insufficient documentation

## 2012-07-16 DIAGNOSIS — R110 Nausea: Secondary | ICD-10-CM | POA: Insufficient documentation

## 2012-07-16 HISTORY — DX: Gastro-esophageal reflux disease without esophagitis: K21.9

## 2012-07-16 HISTORY — DX: Essential (primary) hypertension: I10

## 2012-07-16 LAB — URINALYSIS, ROUTINE W REFLEX MICROSCOPIC
Bilirubin Urine: NEGATIVE
Glucose, UA: NEGATIVE mg/dL
Hgb urine dipstick: NEGATIVE
Ketones, ur: NEGATIVE mg/dL
Protein, ur: NEGATIVE mg/dL
Urobilinogen, UA: 0.2 mg/dL (ref 0.0–1.0)

## 2012-07-16 LAB — COMPREHENSIVE METABOLIC PANEL
AST: 22 U/L (ref 0–37)
Albumin: 3.7 g/dL (ref 3.5–5.2)
BUN: 21 mg/dL (ref 6–23)
Calcium: 9.4 mg/dL (ref 8.4–10.5)
Chloride: 102 mEq/L (ref 96–112)
Creatinine, Ser: 0.99 mg/dL (ref 0.50–1.35)
Total Bilirubin: 0.3 mg/dL (ref 0.3–1.2)
Total Protein: 6.9 g/dL (ref 6.0–8.3)

## 2012-07-16 LAB — CBC
HCT: 45.6 % (ref 39.0–52.0)
MCH: 31.2 pg (ref 26.0–34.0)
MCHC: 35.1 g/dL (ref 30.0–36.0)
MCV: 88.9 fL (ref 78.0–100.0)
Platelets: 270 10*3/uL (ref 150–400)
RDW: 12.6 % (ref 11.5–15.5)

## 2012-07-16 MED ORDER — HYDROMORPHONE HCL PF 1 MG/ML IJ SOLN
0.5000 mg | Freq: Once | INTRAMUSCULAR | Status: AC
  Administered 2012-07-16: 0.5 mg via INTRAVENOUS
  Filled 2012-07-16: qty 1

## 2012-07-16 MED ORDER — OXYCODONE-ACETAMINOPHEN 5-325 MG PO TABS: 1.0000 | ORAL_TABLET | ORAL | Status: DC | PRN

## 2012-07-16 MED ORDER — CIPROFLOXACIN HCL 500 MG PO TABS: 500.0000 mg | ORAL_TABLET | Freq: Two times a day (BID) | ORAL | Status: DC

## 2012-07-16 MED ORDER — KETOROLAC TROMETHAMINE 30 MG/ML IJ SOLN
30.0000 mg | Freq: Once | INTRAMUSCULAR | Status: AC
  Administered 2012-07-16: 30 mg via INTRAVENOUS
  Filled 2012-07-16: qty 1

## 2012-07-16 MED ORDER — SODIUM CHLORIDE 0.9 % IV SOLN
Freq: Once | INTRAVENOUS | Status: AC
  Administered 2012-07-16: 13:00:00 via INTRAVENOUS

## 2012-07-16 MED ORDER — IOHEXOL 300 MG/ML  SOLN
50.0000 mL | Freq: Once | INTRAMUSCULAR | Status: AC | PRN
  Administered 2012-07-16: 50 mL via ORAL

## 2012-07-16 MED ORDER — SODIUM CHLORIDE 0.9 % IV BOLUS (SEPSIS)
500.0000 mL | Freq: Once | INTRAVENOUS | Status: AC
  Administered 2012-07-16: 500 mL via INTRAVENOUS

## 2012-07-16 MED ORDER — IOHEXOL 300 MG/ML  SOLN
100.0000 mL | Freq: Once | INTRAMUSCULAR | Status: AC | PRN
  Administered 2012-07-16: 100 mL via INTRAVENOUS

## 2012-07-16 NOTE — Progress Notes (Signed)
Pt confirms [facility]cp [facility], dr Karren Burly williams per pt and male at bedside EPIC updated

## 2012-07-16 NOTE — ED Notes (Signed)
MD at bedside. 

## 2012-07-16 NOTE — ED Notes (Signed)
Patient transported to CT 

## 2012-07-16 NOTE — ED Provider Notes (Signed)
History     CSN: [id number]  Arrival date & time 07/16/12  1120   First MD Initiated Contact with Patient 07/16/12 1124      Chief Complaint  Patient presents with  . Urinary Tract Infection  . Abdominal Pain    (Consider location/radiation/quality/duration/timing/severity/associated sxs/prior treatment) Patient is a 52 y.o. male presenting with urinary tract infection and abdominal pain.  Urinary Tract Infection Associated symptoms include abdominal pain. Pertinent negatives include no chest pain, chills, congestion, fever, nausea, neck pain, numbness, sore throat, vomiting or weakness.  Abdominal Pain Associated symptoms: dysuria   Associated symptoms: no chest pain, no chills, no fever, no hematuria, no nausea, no shortness of breath, no sore throat and no vomiting    Patient is a 52 yo male presenting with dysuria x 8 days. Has associated bilateral lower abdominal pain radiating to low back and testicular pain. Patient was seen by his physician for similar symptoms at the onset of presentation and was prescribed Abx for this. Symptoms had been resolving while on Abx therapy, but then returned last night. Patient describes abdominal pain as pressure and rates it at a 10/10. Aggrevating factors: changing positions and ambulating. Alleviating factors: none. Ineffective treatments: ibuprofen. Has frequency, urgency, nausea, and headache. Denies fevers, chills, vomiting, hematuria, rectal pain, bowel incontinence, numbness, tingling, weakness, lightheadedness, dizziness, visual changes, URI symptoms. Patient denies concern for STI.      Past Medical History  Diagnosis Date  . GERD (gastroesophageal reflux disease)   . Hypertension     Past Surgical History  Procedure Laterality Date  . Esophageal dilation      No family history on file.  History  Substance Use Topics  . Smoking status: Never Smoker   . Smokeless tobacco: Not on file  . Alcohol Use: No      Review of  Systems  Constitutional: Negative for fever and chills.  HENT: Negative for congestion, sore throat, rhinorrhea, neck pain and neck stiffness.   Eyes: Negative for visual disturbance.  Respiratory: Negative for shortness of breath.   Cardiovascular: Negative for chest pain.  Gastrointestinal: Positive for abdominal pain. Negative for nausea, vomiting, blood in stool, anal bleeding and rectal pain.  Genitourinary: Positive for dysuria, flank pain and testicular pain. Negative for hematuria, discharge, enuresis and penile pain.  Musculoskeletal: Negative for back pain.  Neurological: Negative for dizziness, weakness, light-headedness and numbness.  Psychiatric/Behavioral: The patient is not nervous/anxious.     Allergies  Review of patient's allergies indicates no known allergies.  Home Medications  No current outpatient prescriptions on file.  BP 153/100  Pulse 77  Temp(Src) 97.9 F (36.6 C) (Oral)  Resp 14  SpO2 98%  Physical Exam  Constitutional: He is oriented to person, place, and time. He appears well-developed and well-nourished.  HENT:  Head: Normocephalic and atraumatic.  Cardiovascular: Normal rate, regular rhythm and normal heart sounds.   Pulmonary/Chest: Effort normal and breath sounds normal. No respiratory distress.  Abdominal: Soft. Bowel sounds are normal. He exhibits no distension. There is no tenderness. There is no rebound and no CVA tenderness.  Genitourinary: Testes normal and penis normal.  Musculoskeletal:       Arms: Lymphadenopathy:       Right: No inguinal adenopathy present.       Left: No inguinal adenopathy present.  Neurological: He is alert and oriented to person, place, and time.  Skin: Skin is warm and dry.    ED Course  Procedures (including critical care time)  ED Course was discussed with Dr. Adriana Simas.  Labs and UA have returned within nml limits, CT scan ordered for further evaluation.  Had a long conversation with the patient about the  findings on the CT scan. Sat with the patient and his family until all questions were answered to the best of my ability. Patient and family were upset over the news of a lesion, but were agreeable with the plan to move forward to get the lesion further evaluated to determine the exact nature of the lesion whether benign or cancerous or other.  Possible prostatitis diagnosis was also discussed with the family given that the labs, UA, and CT scan had no findings for another explanation for his pain and dysuria. The patient was comfortable with the plan to follow up with Mr. Berneice Heinrich in urology tomorrow morning and start antibiotics pending the possible infection. The DRE was deferred because of patient's state after discussing the CT. It was not performed earlier, because patient denied any symptoms related to prostatitis such as rectal pain.  Labs Reviewed  URINALYSIS, ROUTINE W REFLEX MICROSCOPIC  CBC  COMPREHENSIVE METABOLIC PANEL   Ct Abdomen Pelvis W Contrast  07/16/2012  *RADIOLOGY REPORT*  Clinical Data: Mid abdominal pain.  Urinary tract infection.  CT ABDOMEN AND PELVIS WITH CONTRAST  Technique:  Multidetector CT imaging of the abdomen and pelvis was performed following the standard protocol during bolus administration of intravenous contrast.  Contrast: 50mL OMNIPAQUE IOHEXOL 300 MG/ML  SOLN, OMNIPAQUE IOHEXOL 300 MG/ML  SOLN  Comparison: None.  Findings: Mild atelectasis at the lung bases.  No pericardial fluid.  No focal hepatic lesion.  The gallbladder, pancreas, spleen, and adrenal glands are normal.  There is a low density lesion in the cortex of the right kidney measuring 11 x 80 mm (image 37).  This cannot be characterized as simple cyst.  Similar 13 mm lesion in the left kidney (image 10, series 3). There is a small enhancing cortical nodule superior to the low density lesion (image 23, series 2).  Contrast remains  in a dilated esophagus.  There is thickening through the GE junction.   The small bowel is normal.  Appendix is normal.  Colon and rectosigmoid colon are normal.  Abdominal aorta normal caliber.  No retroperitoneal periportal lymphadenopathy.  No mesenteric disease.  No free fluid the pelvis.  The bladder prostate gland normal.  No pelvic lymphadenopathy.  IMPRESSION: 1. Contrast within a dilated distal esophagus.  Thickened GE junction.  Achalasia described on comparison esophagram of 10/21/2007.  2.  Indeterminate renal lesions.  Recommend non emergent MRI with and without contrast further characterization to exclude neoplasm.   Original Report Authenticated By: Genevive Bi, M.D.      1. Prostatitis       MDM  Patient is a 52 yo male presenting to ED with dysuria x 8 days. Patient had been placed on Abx (Macrodantin) by his PCP for diagnosis of UTI. Patient's symptoms had been improving until yesterday. Patient was re-evaluated with CBC and UA for a new or recurrent infection. All labs and UA results were within normal limits. Patient underwent CT scan of the abdomen with contrast for further evaluation of possible kidney stone or other etiology for abdominal pain. The CT scan was unremarkable as noted above except for noting indeterminate renal lesions. Radiology advised a follow up non-emergent MR to further evaluate the lesions. I had a long discussion with Mr. Maggio and his wife about these findings on the  CT scan explaining the findings and the recommendations to move forward. We had a long discussion about how we could not know what the lesions were based on the CT scan. I answered all questions to the best of my ability and sat with the patient and his family until all of their questions were answered. They seems comfortable with the plan to follow up with his PCP tomorrow to set up the MR scan. Although the labs and UA were within normal limits the patient did have symptoms that could be consistent with epididymitis or prostatitis, the lack of UA findings could  have been due to the fact that the patient was on Macrodantin at the time of the UA. The patient did not have any physical exam findings that were distinctly consistent with epipdidymitis or prostatitis besides dysuria and lower abdominal pain. Patient was advised to follow up with Dr. Berneice Heinrich in Urology tomorrow for further evaluation of his dysuria. I prophylactically started the patient on PCN to cover him for a possible prostatitis. The patient understood to start the antibiotic regimen as soon as possible and stay on it pending what urology finds on that follow up appointment. Patient was given a letter to excuse him from work today and for the next few days to allow for time to schedule all follow up appointments and the MR. He was given a prescription for Percocet and was advised he should not use alcohol while on this medication or drive or operate heavy machinery as it may make him drowsy or sleepy. Mr. Mcquerry was stable at the time of discharge and was only discharged after he felt comfortable with his plans to follow up with his PCP, the Urologist, and why we were performing the MR as an outpatient.       Jeannetta Ellis, PA-C 07/16/12 2108

## 2012-07-16 NOTE — ED Notes (Signed)
Pt reports being Dx with UTI 8 days ago at PCP. Pain has been going on for 2 weeks. No relief after abx. Reports burning with urination.

## 2012-07-17 NOTE — ED Provider Notes (Signed)
Medical screening examination/treatment/procedure(s) were conducted as a shared visit with non-physician practitioner(s) and myself.  I personally evaluated the patient during the encounter. Bilateral flank pain with radiation to the suprapubic area.  Negative urinalysis.  CT results as documented.  Results of testing discussed with patient and his family. They will get urological followup  Donnetta Hutching, MD 07/17/12 1316

## 2012-07-18 ENCOUNTER — Encounter (HOSPITAL_COMMUNITY): Payer: Self-pay | Admitting: *Deleted

## 2012-07-18 ENCOUNTER — Emergency Department (HOSPITAL_COMMUNITY): Payer: BC Managed Care – PPO

## 2012-07-18 ENCOUNTER — Emergency Department (HOSPITAL_COMMUNITY)
Admission: EM | Admit: 2012-07-18 | Discharge: 2012-07-18 | Disposition: A | Discharge status: Home / Self Care | Payer: BC Managed Care – PPO | Attending: Emergency Medicine | Admitting: Emergency Medicine

## 2012-07-18 DIAGNOSIS — R3915 Urgency of urination: Secondary | ICD-10-CM | POA: Insufficient documentation

## 2012-07-18 DIAGNOSIS — Q619 Cystic kidney disease, unspecified: Secondary | ICD-10-CM | POA: Insufficient documentation

## 2012-07-18 DIAGNOSIS — I10 Essential (primary) hypertension: Secondary | ICD-10-CM | POA: Insufficient documentation

## 2012-07-18 DIAGNOSIS — N509 Disorder of male genital organs, unspecified: Secondary | ICD-10-CM | POA: Insufficient documentation

## 2012-07-18 DIAGNOSIS — K219 Gastro-esophageal reflux disease without esophagitis: Secondary | ICD-10-CM | POA: Insufficient documentation

## 2012-07-18 DIAGNOSIS — N508 Other specified disorders of male genital organs: Secondary | ICD-10-CM | POA: Insufficient documentation

## 2012-07-18 DIAGNOSIS — R109 Unspecified abdominal pain: Secondary | ICD-10-CM | POA: Insufficient documentation

## 2012-07-18 DIAGNOSIS — R350 Frequency of micturition: Secondary | ICD-10-CM | POA: Insufficient documentation

## 2012-07-18 DIAGNOSIS — R300 Dysuria: Secondary | ICD-10-CM | POA: Insufficient documentation

## 2012-07-18 DIAGNOSIS — Z79899 Other long term (current) drug therapy: Secondary | ICD-10-CM | POA: Insufficient documentation

## 2012-07-18 LAB — URINALYSIS, ROUTINE W REFLEX MICROSCOPIC
Glucose, UA: NEGATIVE mg/dL
Leukocytes, UA: NEGATIVE
Protein, ur: NEGATIVE mg/dL
Specific Gravity, Urine: 1.025 (ref 1.005–1.030)

## 2012-07-18 MED ORDER — OXYCODONE-ACETAMINOPHEN 5-325 MG PO TABS
2.0000 | ORAL_TABLET | Freq: Once | ORAL | Status: AC
  Administered 2012-07-18: 2 via ORAL
  Filled 2012-07-18: qty 2

## 2012-07-18 NOTE — ED Provider Notes (Signed)
History     CSN: [id number]  Arrival date & time 07/18/12  4098   First MD Initiated Contact with Patient 07/18/12 1005      Chief Complaint  Patient presents with  . Flank Pain    Right  . Testicle Pain    (Consider location/radiation/quality/duration/timing/severity/associated sxs/prior treatment) HPI Comments: 52 year old Hispanic male presents emergency department complaining of bilateral flank pain radiating to both his back and his groin and testicle area x2 weeks. He was seen in the ED 2 days ago on March 18 for the same. The pain has not changed and described as a pressure rated 10 out of 10, worse with changing positions and walking. He has been taking hydrocodone with only "very little" relief. He had a CT scan at that visit which showed indeterminate renal lesions. The PA who saw patient discussed the recommendation for outpatient followup MRI after seeing urologist, however patient insists he was told he needs an MRI emergently. He is very frustrated if you do not receive an MRI 2 days prior. He was also presenting with symptoms of prostatitis at that time and was placed on Cipro. Prior to that visit he was seen by his PCP and prescribed Macrodantin for a urinary tract infection. Currently admits to "orange-colored urine" with associated dysuria, increased frequency and urgency. Denies fever, chills, nausea or vomiting, testicular swelling.  Patient is a 53 y.o. male presenting with flank pain and testicular pain. The history is provided by the patient and the spouse.  Flank Pain Pertinent negatives include no chest pain, chills, fever, nausea or vomiting.  Testicle Pain Pertinent negatives include no chest pain, chills, fever, nausea or vomiting.    Past Medical History  Diagnosis Date  . GERD (gastroesophageal reflux disease)   . Hypertension     Past Surgical History  Procedure Laterality Date  . Esophageal dilation      History reviewed. No pertinent family  history.  History  Substance Use Topics  . Smoking status: Never Smoker   . Smokeless tobacco: Never Used  . Alcohol Use: No      Review of Systems  Constitutional: Negative for fever and chills.  Respiratory: Negative for shortness of breath.   Cardiovascular: Negative for chest pain.  Gastrointestinal: Negative for nausea and vomiting.  Genitourinary: Positive for dysuria, urgency, frequency, flank pain and testicular pain. Negative for scrotal swelling.  Musculoskeletal: Negative for back pain.  All other systems reviewed and are negative.    Allergies  Review of patient's allergies indicates no known allergies.  Home Medications   Current Outpatient Rx  Name  Route  Sig  Dispense  Refill  . ciprofloxacin (CIPRO) 500 MG tablet   Oral   Take 1 tablet (500 mg total) by mouth 2 (two) times daily.   56 tablet   0   . ibuprofen (ADVIL,MOTRIN) 400 MG tablet   Oral   Take 400 mg by mouth every 6 (six) hours as needed for pain.         Marland Kitchen lisinopril-hydrochlorothiazide (PRINZIDE,ZESTORETIC) 20-12.5 MG per tablet   Oral   Take 1 tablet by mouth daily.         Marland Kitchen omeprazole (PRILOSEC) 40 MG capsule   Oral   Take 40 mg by mouth daily.         Marland Kitchen oxyCODONE-acetaminophen (PERCOCET/ROXICET) 5-325 MG per tablet   Oral   Take 1-2 tablets by mouth every 4 (four) hours as needed for pain.   15 tablet  0   . phenazopyridine (PYRIDIUM) 200 MG tablet   Oral   Take 200 mg by mouth 3 (three) times daily as needed for pain (pain).           BP 140/94  Pulse 74  Temp(Src) 98.1 F (36.7 C) (Oral)  Resp 18  Wt 176 lb (79.833 kg)  SpO2 98%  Physical Exam  Nursing note and vitals reviewed. Constitutional: He is oriented to person, place, and time. He appears well-developed and well-nourished. No distress.  HENT:  Head: Normocephalic and atraumatic.  Mouth/Throat: Oropharynx is clear and moist.  Eyes: Conjunctivae and EOM are normal. Pupils are equal, round, and  reactive to light.  Neck: Normal range of motion. Neck supple.  Cardiovascular: Normal rate, regular rhythm, normal heart sounds and intact distal pulses.   Pulmonary/Chest: Effort normal and breath sounds normal. No respiratory distress. He has no wheezes. He has no rales.  Abdominal: Normal appearance and bowel sounds are normal. He exhibits no distension and no mass. There is tenderness. There is no rigidity, no rebound, no guarding and no CVA tenderness. No hernia. Hernia confirmed negative in the right inguinal area and confirmed negative in the left inguinal area.    Genitourinary: Prostate normal and penis normal. Rectal exam shows no tenderness. Prostate is not enlarged and not tender. Right testis shows tenderness. Right testis shows no mass and no swelling. Left testis shows no mass, no swelling and no tenderness. Uncircumcised. No penile erythema or penile tenderness. No discharge found.  Musculoskeletal: Normal range of motion. He exhibits no edema.  Neurological: He is alert and oriented to person, place, and time.  Skin: Skin is warm and dry. No rash noted. No erythema.  Psychiatric: He has a normal mood and affect. His behavior is normal.    ED Course  Procedures (including critical care time)  Labs Reviewed  URINALYSIS, ROUTINE W REFLEX MICROSCOPIC - Abnormal; Notable for the following:    APPearance CLOUDY (*)    All other components within normal limits   US Scrotum  07/18/2012  *RADIOLOGY REPORT*  Clinical Data:  Right-sided scrotal pain.  SCROTAL ULTRASOUND DOPPLER ULTRASOUND OF THE TESTICLES  Technique: Complete ultrasound examination of the testicles, epididymis, and other scrotal structures was performed.  Color and spectral Doppler ultrasound were also utilized to evaluate blood flow to the testicles.  Comparison:  CT scan 07/16/2012.  Findings:  Right testis:  Measures 3.4 x 1.8 x 3.0 cm.  Normal echogenicity without focal lesions.  Patent intra testicular blood flow.   Left testis:  Measures 3.4 x 2.2 x 2.6 cm.  Homogeneous echogenicity without focal lesions.  Patent intra testicular blood flow.  Right epididymis:  Small epididymal cysts.  Left epididymis:  Small epididymal cysts.  Hydrocele:  Small bilateral hydroceles.  Varicocele:  None  Pulsed Doppler interrogation of both testes demonstrates low resistance flow bilaterally.  IMPRESSION: 1.  Normal sonographic appearance of both testicles and patent blood flow. 2.  Small epididymal cysts. 3.  Small bilateral hydroceles.   Original Report Authenticated By: Rudie Meyer, M.D.    Ct Abdomen Pelvis W Contrast  07/16/2012  *RADIOLOGY REPORT*  Clinical Data: Mid abdominal pain.  Urinary tract infection.  CT ABDOMEN AND PELVIS WITH CONTRAST  Technique:  Multidetector CT imaging of the abdomen and pelvis was performed following the standard protocol during bolus administration of intravenous contrast.  Contrast: 50mL OMNIPAQUE IOHEXOL 300 MG/ML  SOLN, OMNIPAQUE IOHEXOL 300 MG/ML  SOLN  Comparison: None.  Findings: Mild atelectasis at the lung bases.  No pericardial fluid.  No focal hepatic lesion.  The gallbladder, pancreas, spleen, and adrenal glands are normal.  There is a low density lesion in the cortex of the right kidney measuring 11 x 80 mm (image 37).  This cannot be characterized as simple cyst.  Similar 13 mm lesion in the left kidney (image 10, series 3). There is a small enhancing cortical nodule superior to the low density lesion (image 23, series 2).  Contrast remains  in a dilated esophagus.  There is thickening through the GE junction.  The small bowel is normal.  Appendix is normal.  Colon and rectosigmoid colon are normal.  Abdominal aorta normal caliber.  No retroperitoneal periportal lymphadenopathy.  No mesenteric disease.  No free fluid the pelvis.  The bladder prostate gland normal.  No pelvic lymphadenopathy.  IMPRESSION: 1. Contrast within a dilated distal esophagus.  Thickened GE junction.  Achalasia  described on comparison esophagram of 10/21/2007.  2.  Indeterminate renal lesions.  Recommend non emergent MRI with and without contrast further characterization to exclude neoplasm.   Original Report Authenticated By: Genevive Bi, M.D.    [place] Art/ven Flow Abd Pelv Doppler  07/18/2012  *RADIOLOGY REPORT*  Clinical Data:  Right-sided scrotal pain.  SCROTAL ULTRASOUND DOPPLER ULTRASOUND OF THE TESTICLES  Technique: Complete ultrasound examination of the testicles, epididymis, and other scrotal structures was performed.  Color and spectral Doppler ultrasound were also utilized to evaluate blood flow to the testicles.  Comparison:  CT scan 07/16/2012.  Findings:  Right testis:  Measures 3.4 x 1.8 x 3.0 cm.  Normal echogenicity without focal lesions.  Patent intra testicular blood flow.  Left testis:  Measures 3.4 x 2.2 x 2.6 cm.  Homogeneous echogenicity without focal lesions.  Patent intra testicular blood flow.  Right epididymis:  Small epididymal cysts.  Left epididymis:  Small epididymal cysts.  Hydrocele:  Small bilateral hydroceles.  Varicocele:  None  Pulsed Doppler interrogation of both testes demonstrates low resistance flow bilaterally.  IMPRESSION: 1.  Normal sonographic appearance of both testicles and patent blood flow. 2.  Small epididymal cysts. 3.  Small bilateral hydroceles.   Original Report Authenticated By: Rudie Meyer, M.D.      1. Flank pain   2. Bilateral renal cysts   3. Testicular cyst       MDM  52 y/o male with continuing but unchanged flank pain since being seen in ED 2 days prior. CT scan as mentioned before showing renal cysts. I discussed this again with him and told him the MRI is nothing needed today. I did obtain scrotal US due to R scrotal tenderness which shows small bilateral hydroceles and small epididymal cysts. He is aware this is not an emergency and will discuss this with Dr. Berneice Heinrich. U/A unremarkable. He is on cipro for 21 days and has pain medication. Return  precautions discussed. Patient states understanding of plan and is agreeable.         Trevor Mace, PA-C 07/18/12 1226

## 2012-07-18 NOTE — ED Notes (Signed)
Pt from home with reports of bilateral flank pain and testicle pain that started about 2 weeks ago. Pt reports being treated here for same and was told that he has a "ball in his kidneys" and was told to f/u with Urologist and pt has appt on Monday but due to pain pt could not wait. Pt reports that he was told that he needs an MRI and is upset that one has not been done.

## 2012-07-18 NOTE — ED Provider Notes (Signed)
Medical screening examination/treatment/procedure(s) were performed by non-physician practitioner and as supervising physician I was immediately available for consultation/collaboration.  Ethelda Chick, MD 07/18/12 [phone]

## 2012-07-22 ENCOUNTER — Other Ambulatory Visit (HOSPITAL_COMMUNITY): Payer: Self-pay | Admitting: Urology

## 2012-07-22 DIAGNOSIS — N281 Cyst of kidney, acquired: Secondary | ICD-10-CM

## 2012-07-24 ENCOUNTER — Ambulatory Visit (HOSPITAL_COMMUNITY)
Admission: RE | Admit: 2012-07-24 | Discharge: 2012-07-24 | Disposition: A | Discharge status: Home / Self Care | Payer: BC Managed Care – PPO | Source: Ambulatory Visit | Attending: Urology | Admitting: Urology

## 2012-07-24 DIAGNOSIS — N281 Cyst of kidney, acquired: Secondary | ICD-10-CM

## 2012-07-24 DIAGNOSIS — R109 Unspecified abdominal pain: Secondary | ICD-10-CM | POA: Insufficient documentation

## 2012-07-24 DIAGNOSIS — N289 Disorder of kidney and ureter, unspecified: Secondary | ICD-10-CM | POA: Insufficient documentation

## 2012-07-24 DIAGNOSIS — Q619 Cystic kidney disease, unspecified: Secondary | ICD-10-CM | POA: Insufficient documentation

## 2012-07-24 DIAGNOSIS — N390 Urinary tract infection, site not specified: Secondary | ICD-10-CM | POA: Insufficient documentation

## 2012-07-24 MED ORDER — GADOBENATE DIMEGLUMINE 529 MG/ML IV SOLN
16.0000 mL | Freq: Once | INTRAVENOUS | Status: AC | PRN
  Administered 2012-07-24: 16 mL via INTRAVENOUS

## 2012-12-06 ENCOUNTER — Emergency Department (HOSPITAL_COMMUNITY)
Admission: EM | Admit: 2012-12-06 | Discharge: 2012-12-06 | Disposition: A | Discharge status: Home / Self Care | Payer: BC Managed Care – PPO | Attending: Emergency Medicine | Admitting: Emergency Medicine

## 2012-12-06 ENCOUNTER — Emergency Department (HOSPITAL_COMMUNITY): Payer: BC Managed Care – PPO

## 2012-12-06 ENCOUNTER — Encounter (HOSPITAL_COMMUNITY): Payer: Self-pay | Admitting: Emergency Medicine

## 2012-12-06 DIAGNOSIS — G8929 Other chronic pain: Secondary | ICD-10-CM | POA: Insufficient documentation

## 2012-12-06 DIAGNOSIS — M79609 Pain in unspecified limb: Secondary | ICD-10-CM | POA: Insufficient documentation

## 2012-12-06 DIAGNOSIS — I10 Essential (primary) hypertension: Secondary | ICD-10-CM | POA: Insufficient documentation

## 2012-12-06 DIAGNOSIS — IMO0002 Reserved for concepts with insufficient information to code with codable children: Secondary | ICD-10-CM | POA: Insufficient documentation

## 2012-12-06 DIAGNOSIS — M5416 Radiculopathy, lumbar region: Secondary | ICD-10-CM

## 2012-12-06 DIAGNOSIS — K219 Gastro-esophageal reflux disease without esophagitis: Secondary | ICD-10-CM | POA: Insufficient documentation

## 2012-12-06 DIAGNOSIS — Z79899 Other long term (current) drug therapy: Secondary | ICD-10-CM | POA: Insufficient documentation

## 2012-12-06 LAB — POCT I-STAT, CHEM 8
Calcium, Ion: 1.18 mmol/L (ref 1.12–1.23)
Glucose, Bld: 82 mg/dL (ref 70–99)
HCT: 46 % (ref 39.0–52.0)
Hemoglobin: 15.6 g/dL (ref 13.0–17.0)
TCO2: 23 mmol/L (ref 0–100)

## 2012-12-06 MED ORDER — HYDROCODONE-ACETAMINOPHEN 5-325 MG PO TABS
2.0000 | ORAL_TABLET | Freq: Once | ORAL | Status: AC
  Administered 2012-12-06: 2 via ORAL
  Filled 2012-12-06: qty 2

## 2012-12-06 MED ORDER — HYDROCODONE-ACETAMINOPHEN 5-325 MG PO TABS: ORAL_TABLET | ORAL | Status: DC

## 2012-12-06 MED ORDER — IBUPROFEN 800 MG PO TABS
800.0000 mg | ORAL_TABLET | Freq: Once | ORAL | Status: AC
  Administered 2012-12-06: 800 mg via ORAL
  Filled 2012-12-06: qty 1

## 2012-12-06 NOTE — ED Provider Notes (Signed)
CSN: [id number]     Arrival date & time 12/06/12  4098 History     First MD Initiated Contact with Patient 12/06/12 [phone]     Chief Complaint  Patient presents with  . Back Pain  . Arm Pain   (Consider location/radiation/quality/duration/timing/severity/associated sxs/prior Treatment) HPI  [name] is a 52 y.o. male with past medical history significant for acid reflux and hypertension complaining of exacerbation of chronic low back pain radiating down the left leg to the knee. 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 He had a 10 Hour drive 3 days ago the set off this current exacerbation. He rates his pain at 10 out of 10, he has been taking acetaminophen with little relief. Pain became more severe last night. Patient also states that there is a mild pain in his left arm, patient denies chest pain, shortness of breath, nausea vomiting, abdominal pain, and numbness, weakness, paresthesia, change in bowel or bladder habits, fever, history of IV drug use, history of cancer.  Past Medical History  Diagnosis Date  . GERD (gastroesophageal reflux disease)   . Hypertension    Past Surgical History  Procedure Laterality Date  . Esophageal dilation     History reviewed. No pertinent family history. History  Substance Use Topics  . Smoking status: Never Smoker   . Smokeless tobacco: Never Used  . Alcohol Use: No    Review of Systems 10 systems reviewed and found to be negative, except as noted in the HPI  Allergies  Review of patient's allergies indicates no known allergies.  Home Medications   Current Outpatient Rx  Name  Route  Sig  Dispense  Refill  . acetaminophen (TYLENOL) 500 MG tablet   Oral   Take 500 mg by mouth every 6 (six) hours as needed for pain.         Marland Kitchen lisinopril-hydrochlorothiazide (PRINZIDE,ZESTORETIC) 20-12.5 MG per tablet   Oral   Take 1 tablet by mouth daily.         Marland Kitchen OVER THE COUNTER  MEDICATION   Topical   Apply 1 application topically 2 (two) times daily as needed (for pain in hip).         . ranitidine (ZANTAC) 150 MG tablet   Oral   Take 150 mg by mouth 2 (two) times daily.          BP 129/72  Pulse 68  Temp(Src) 97.9 F (36.6 C) (Oral)  Resp 18  SpO2 98% Physical Exam  Nursing note and vitals reviewed. Constitutional: He is oriented to person, place, and time. He appears well-developed and well-nourished. No distress.  HENT:  Head: Normocephalic and atraumatic.  Mouth/Throat: Oropharynx is clear and moist.  Eyes: Conjunctivae and EOM are normal. Pupils are equal, round, and reactive to light.  Neck: Normal range of motion. No JVD present.  Cardiovascular: Normal rate, regular rhythm and intact distal pulses.   Pulmonary/Chest: Effort normal and breath sounds normal. No stridor. No respiratory distress. He has no wheezes. He has no rales. He exhibits no tenderness.  Abdominal: Soft. Bowel sounds are normal. He exhibits no distension and no mass. There is no tenderness. There is no rebound and no guarding.  Musculoskeletal: Normal range of motion. He exhibits no edema and no tenderness.  No calf asymmetry, superficial collaterals, palpable cords, edema, Homans sign negative bilaterally.   No point tenderness over lumbar vertebrae, strength is 5 out of  5 in hip flexion bilaterally. Sensation grossly intact,  patellar reflexes are 2+ b/l. No clonus. Ankle  and extensor hallucis longus strength is 5 out of 5 bilaterally.   Strait leg raise positive on the ipsilateral (left side) at 30, positive on the contralateral side at 50.  Ambulates with antalgic gait.    Neurological: He is alert and oriented to person, place, and time.  Psychiatric: He has a normal mood and affect.    ED Course   Procedures (including critical care time)  Labs Reviewed  POCT I-STAT, CHEM 8  POCT I-STAT TROPONIN I   Dg Lumbar Spine Complete  12/06/2012   *RADIOLOGY  REPORT*  Clinical Data: Low back pain with left-sided radicular symptoms  LUMBAR SPINE - COMPLETE 4+ VIEW  Comparison: None.  Findings: Frontal, lateral, spot lumbosacral lateral, and bilateral oblique views were obtained.  There are five non-rib bearing lumbar type vertebral bodies.  There is mild levorotoscoliosis.  There is no fracture or spondylolisthesis.  There is moderate disc space narrowing at L5 - S1 with mild vacuum phenomenon.  There is mild disc space narrowing at all other levels.  There is facet osteoarthritic change at L4-5 and L5-S1 bilaterally, mild.  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  No fracture or spondylolisthesis.  Mild scoliosis.   Original Report Authenticated By: Bretta Bang, M.D.  '  Date: 12/06/2012  Rate: 52  Rhythm: Sinus bradycardia with PACs  QRS Axis: normal  Intervals: normal  ST/T Wave abnormalities: normal, J-point elevation  Conduction Disutrbances:none  Narrative Interpretation:   Old EKG Reviewed: unchanged  1. Lumbar radiculopathy, chronic     MDM   Filed Vitals:   12/06/12 0902  BP: 129/72  Pulse: 68  Temp: 97.9 F (36.6 C)  TempSrc: Oral  Resp: 18  SpO2: 98%     [name] is a 52 y.o. male with what sounds like exacerbation of chronic sciatica musculoskeletal pain. There is a triage initiated cardiac workup which shows a nonischemic EKG, normal i-STAT and troponin  Medications  ibuprofen (ADVIL,MOTRIN) tablet 800 mg (not administered)  HYDROcodone-acetaminophen (NORCO/VICODIN) 5-325 MG per tablet 2 tablet (not administered)    Pt is hemodynamically stable, appropriate for, and amenable to discharge at this time. Pt verbalized understanding and agrees with care plan. All questions answered. Outpatient follow-up and specific return precautions discussed.    New Prescriptions   HYDROCODONE-ACETAMINOPHEN (NORCO/VICODIN) 5-325 MG PER TABLET    Take 1-2 tablets by mouth every 6 hours as needed for pain.    Note:  Portions of this report may have been transcribed using voice recognition software. Every effort was made to ensure accuracy; however, inadvertent computerized transcription errors may be present    Wynetta Emery, PA-C 12/06/12 1017

## 2012-12-06 NOTE — ED Notes (Signed)
PT given vicodin immediately before discharge.  Notified of danger of driving after taking vicodin.  Pt states he is driving immediately home and that he lives 5 minutes away.

## 2012-12-06 NOTE — ED Notes (Signed)
Pt c/o left lower back pain to left leg but also c/o left arm pain and numbness. When questioned about chest pain he admitted to chest 'heaviness and SOB' associated with left arm pain. States he has it "everyday". "

## 2012-12-06 NOTE — ED Provider Notes (Signed)
Medical screening examination/treatment/procedure(s) were performed by non-physician practitioner and as supervising physician I was immediately available for consultation/collaboration.  Gottfried Standish, MD 12/06/12 1122 

## 2012-12-06 NOTE — ED Notes (Signed)
Pt now told RN he is having some intermittent CP that denies at present and SOB x months

## 2012-12-06 NOTE — ED Notes (Signed)
Pt sts lower left sided back pain with radiation down left leg and left arm x months that was worse last night

## 2013-09-22 ENCOUNTER — Emergency Department (HOSPITAL_COMMUNITY): Payer: No Typology Code available for payment source

## 2013-09-22 ENCOUNTER — Encounter (HOSPITAL_COMMUNITY): Payer: Self-pay | Admitting: Emergency Medicine

## 2013-09-22 ENCOUNTER — Emergency Department (HOSPITAL_COMMUNITY)
Admission: EM | Admit: 2013-09-22 | Discharge: 2013-09-22 | Disposition: A | Discharge status: Home / Self Care | Payer: No Typology Code available for payment source | Attending: Emergency Medicine | Admitting: Emergency Medicine

## 2013-09-22 DIAGNOSIS — K219 Gastro-esophageal reflux disease without esophagitis: Secondary | ICD-10-CM | POA: Insufficient documentation

## 2013-09-22 DIAGNOSIS — Z79899 Other long term (current) drug therapy: Secondary | ICD-10-CM | POA: Insufficient documentation

## 2013-09-22 DIAGNOSIS — J029 Acute pharyngitis, unspecified: Secondary | ICD-10-CM | POA: Insufficient documentation

## 2013-09-22 DIAGNOSIS — I10 Essential (primary) hypertension: Secondary | ICD-10-CM | POA: Insufficient documentation

## 2013-09-22 DIAGNOSIS — J40 Bronchitis, not specified as acute or chronic: Secondary | ICD-10-CM | POA: Insufficient documentation

## 2013-09-22 MED ORDER — ALBUTEROL SULFATE (2.5 MG/3ML) 0.083% IN NEBU
5.0000 mg | INHALATION_SOLUTION | Freq: Once | RESPIRATORY_TRACT | Status: AC
  Administered 2013-09-22: 5 mg via RESPIRATORY_TRACT
  Filled 2013-09-22: qty 6

## 2013-09-22 MED ORDER — PREDNISONE (PAK) 10 MG PO TABS: ORAL_TABLET | Freq: Every day | ORAL | Status: DC

## 2013-09-22 MED ORDER — ALBUTEROL SULFATE HFA 108 (90 BASE) MCG/ACT IN AERS
2.0000 | INHALATION_SPRAY | Freq: Once | RESPIRATORY_TRACT | Status: AC
  Administered 2013-09-22: 2 via RESPIRATORY_TRACT
  Filled 2013-09-22: qty 6.7

## 2013-09-22 MED ORDER — HYDROCODONE-HOMATROPINE 5-1.5 MG/5ML PO SYRP: 5.0000 mL | ORAL_SOLUTION | Freq: Four times a day (QID) | ORAL | Status: DC | PRN

## 2013-09-22 MED ORDER — IBUPROFEN 800 MG PO TABS
800.0000 mg | ORAL_TABLET | Freq: Once | ORAL | Status: AC
  Administered 2013-09-22: 800 mg via ORAL
  Filled 2013-09-22: qty 1

## 2013-09-22 MED ORDER — AZITHROMYCIN 250 MG PO TABS: 250.0000 mg | ORAL_TABLET | Freq: Every day | ORAL | Status: DC

## 2013-09-22 NOTE — ED Notes (Signed)
Patient transported to X-ray 

## 2013-09-22 NOTE — ED Notes (Addendum)
Pt states he had the flu 3 weeks ago and now is having congestion, off and on fever, and when he coughs his head feels like it is going to explode. Pt also c/o chest congestion and tightness.

## 2013-09-22 NOTE — Discharge Instructions (Signed)
Read the information below.  Use the prescribed medication as directed.  Please discuss all new medications with your pharmacist.  You may return to the Emergency Department at any time for worsening condition or any new symptoms that concern you.  If you develop high fevers that do not resolve with tylenol or ibuprofen, you have difficulty swallowing or breathing, or you are unable to tolerate fluids by mouth, return to the ER for a recheck.       Bronchitis Bronchitis is inflammation of the airways that extend from the windpipe into the lungs (bronchi). The inflammation often causes mucus to develop, which leads to a cough. If the inflammation becomes severe, it may cause shortness of breath. CAUSES  Bronchitis may be caused by:   Viral infections.   Bacteria.   Cigarette smoke.   Allergens, pollutants, and other irritants.  SIGNS AND SYMPTOMS  The most common symptom of bronchitis is a frequent cough that produces mucus. Other symptoms include:  Fever.   Body aches.   Chest congestion.   Chills.   Shortness of breath.   Sore throat.  DIAGNOSIS  Bronchitis is usually diagnosed through a medical history and physical exam. Tests, such as chest X-rays, are sometimes done to rule out other conditions.  TREATMENT  You may need to avoid contact with whatever caused the problem (smoking, for example). Medicines are sometimes needed. These may include:  Antibiotics. These may be prescribed if the condition is caused by bacteria.  Cough suppressants. These may be prescribed for relief of cough symptoms.   Inhaled medicines. These may be prescribed to help open your airways and make it easier for you to breathe.   Steroid medicines. These may be prescribed for those with recurrent (chronic) bronchitis. HOME CARE INSTRUCTIONS  Get plenty of rest.   Drink enough fluids to keep your urine clear or pale yellow (unless you have a medical condition that requires fluid  restriction). Increasing fluids may help thin your secretions and will prevent dehydration.   Only take over-the-counter or prescription medicines as directed by your health care provider.  Only take antibiotics as directed. Make sure you finish them even if you start to feel better.  Avoid secondhand smoke, irritating chemicals, and strong fumes. These will make bronchitis worse. If you are a smoker, quit smoking. Consider using nicotine gum or skin patches to help control withdrawal symptoms. Quitting smoking will help your lungs heal faster.   Put a cool-mist humidifier in your bedroom at night to moisten the air. This may help loosen mucus. Change the water in the humidifier daily. You can also run the hot water in your shower and sit in the bathroom with the door closed for 5 10 minutes.   Follow up with your health care provider as directed.   Wash your hands frequently to avoid catching bronchitis again or spreading an infection to others.  SEEK MEDICAL CARE IF: Your symptoms do not improve after 1 week of treatment.  SEEK IMMEDIATE MEDICAL CARE IF:  Your fever increases.  You have chills.   You have chest pain.   You have worsening shortness of breath.   You have bloody sputum.  You faint.  You have lightheadedness.  You have a severe headache.   You vomit repeatedly. MAKE SURE YOU:   Understand these instructions.  Will watch your condition.  Will get help right away if you are not doing well or get worse. Document Released: 04/17/2005 Document Revised: 02/05/2013 Document Reviewed: 12/10/2012  ExitCare Patient Information 2014 [employer] [place].    Emergency Department Resource Guide 1) Find a Doctor and Pay Out of Pocket Although you won't have to find out who is covered by your insurance plan, it is a good idea to ask around and get recommendations. You will then need to call the office and see if the doctor you have chosen will accept you as a new  patient and what types of options they offer for patients who are self-pay. Some doctors offer discounts or will set up payment plans for their patients who do not have insurance, but you will need to ask so you aren't surprised when you get to your appointment.  2) Contact Your Local Health Department Not all health departments have doctors that can see patients for sick visits, but many do, so it is worth a call to see if yours does. If you don't know where your local health department is, you can check in your phone book. The CDC also has a tool to help you locate your state's health department, and many state websites also have listings of all of their local health departments.  3) Find a Walk-in Clinic If your illness is not likely to be very severe or complicated, you may want to try a walk in clinic. These are popping up all over the country in pharmacies, drugstores, and shopping centers. They're usually staffed by nurse practitioners or physician assistants that have been trained to treat common illnesses and complaints. They're usually fairly quick and inexpensive. However, if you have serious medical issues or chronic medical problems, these are probably not your best option.  No Primary Care Doctor: - Call Health Connect at  [phone] - they can help you locate a primary care doctor that  accepts your insurance, provides certain services, etc. - Physician Referral Service- [phone]  Chronic Pain Problems: Organization         Address  Phone   Notes  [facility]  [phone] Patients need to be referred by their primary care doctor.   Medication Assistance: Organization         Address  Phone   Notes  [facility] [address] [place], [place] [address] [phone] --Must be a resident of [facility] [place] [place] -- Must have NO insurance coverage whatsoever (no Medicaid/ Medicare, etc.) -- The pt. MUST have a primary  care doctor that directs their care regularly and follows them in the community   [facility]ist  [phone]   [employer]  [phone]    Agencies that provide inexpensive medical care: Organization         Address  Phone   Notes  [facility]  [phone]   [facility]    [phone]   [facility] [address] [place], [place] [address] [phone]) [phone]   [facility] [address], [place] [phone]   [facility]    [phone]   [facility]    [phone]   [facility]  [address], [place] Phone:  [phone], Fax:  [phone]) [phone] Hours of Operation:  9 am - 6 pm, M-F.  Also accepts Medicaid/Medicare and self-pay.  [facility]  [address], [place] Phone: [phone], Fax: [phone]. Hours of Operation:  8:30 am - 5:30 pm, M-F.  Also accepts Medicaid and self-pay.  [facility] [place] [address] [place] Phone: [phone]   [place], [place], [place] [phone], Ext. [phone] Mondays & Thursdays: 7-9 AM.  First 15 patients are seen on a first come, first serve basis.    [place] Providers:  Organization         Address  Phone   Notes  [facility] [address], [place] [phone] Also accepts self-pay patients.  [facility] [id number], [place]  [phone]   [place], [address], [place] [phone]   [facility] [address], [place] [phone]   Lucianne Lei [address], [place]   [phone] Only accepts [place] Access [place] patients after they have their name applied to their card.   Self-Pay (no insurance) in [facility]:  Organization         Address  Phone   Notes  Sickle Cell Patients, [facility] [place] [phone]   [facility] [place] [phone]   [facility] [address]  [place], [place], [place] [phone]   [facility]/Dr. Osei-Bonsu  [address], [place] or [address], [place] [phone] Phone number for both [place] and [place] locations is the same.  [facility] [address], [place] [phone]   [facility] [address] [place] or [address] [phone]   [facility] [address], [place] [phone], phone; [phone], fax Sees patients 1st and 3rd Saturday of every month.  Must not qualify for public or private insurance (i.e. Medicaid, Medicare, [place] Health Choice, Veterans' Benefits)  Household income should be no more than 200% of the poverty level The clinic cannot treat you if you are pregnant or think you are pregnant  Sexually transmitted diseases are not treated at the clinic.    Dental Care: Organization         Address  Phone  Notes  [facility] [place] [phone] Accepts children up to age 67 who are enrolled in [place] or [place]; pregnant women with a Medicaid card; and children who have applied for Medicaid or [place] Health Choice, but were declined, whose parents can pay a reduced fee at time of service.  [facility] Department of [facility]  [address], [place] [phone] Accepts children up to age 57 who are enrolled in [place] or [place]; pregnant women with a Medicaid card; and children who have applied for Medicaid or  Health Choice, but were declined, whose parents can  pay a reduced fee at time of service.  [facility]  [place] [phone] Patients are seen by appointment only. Walk-ins are not accepted. [place] will see patients 85 years of age and older. Monday - Tuesday (8am-5pm) Most Wednesdays (8:30-5pm) $30 per visit, cash only  [facility]  [address], [place] [facility] [facility] [phone] Patients are seen by appointment only. Walk-ins are not accepted. [place]  will see patients 18 years of age and older. °One Wednesday Evening (Monthly: Volunteer Based).  $30 per visit, cash only  °[facility]  [phone] for adults; Children under age 4, call Graduate Pediatric Dentistry at [phone]. Children aged 4-14, please call [phone] to request a pediatric application. ° Dental services are provided in all areas of dental care including fillings, crowns and bridges, complete and partial dentures, implants, gum treatment, root canals, and extractions. Preventive care is also provided. Treatment is provided to both adults and children. °Patients are selected via a lottery and there is often a waiting list. °  °[facility] [address], °[place] ° ([phone] [email or web address] °  °[facility] [address], [place], [place] [phone], Ext. [phone] Second and Fourth Thursday of each month, opens at 6:30 AM; Clinic ends at 9 AM.  Patients are seen on a first-come first-served basis, and a limited number are seen during each clinic.  ° °[facility] ° [address], [place], [place] [phone]   Eligibility Requirements °You must have lived in [place], [place], or [place] counties for at least the last three months. °  You cannot be eligible for state or federal sponsored healthcare insurance, including Veterans Administration, Medicaid, or Medicare. °  You generally cannot be eligible for healthcare  insurance through your employer.  °  How to apply: °Eligibility screenings are held every Tuesday and Wednesday afternoon from 1:00 pm until 4:00 pm. You do not need an appointment for the interview!  °[facility] [address], [place], [place] [phone]   °[facility]  336-342-8273   °[facility]  [phone]   °[facility]  336-570-6415   ° °Behavioral Health Resources in the Community: °Intensive Outpatient Programs °Organization         Address  Phone  Notes  °[facility] [address], [place], [place] [phone]   °[facility] [address], [place], [place] [phone]   °[facility] [address], [place], [place] ° 336-882-2125   °[facility] [address],  °[place], [place] 1-800-853-5163 or 336-641-4981   °Substance Abuse Resources °Organization         Address  Phone  Notes  °[facility]  336-882-2125   °[facility]  336-784-9470   °[facility]  336-285-9073   °[facility]  336-845-3988   °Residential & Outpatient Substance Abuse Program  1-800-659-3381   °Psychological Services °Organization         Address  Phone  Notes  °[facility]  336- [phone]   °[facility]  336- 378-7881   °[facility] [address], [place] 1-800-853-5163 or 336-641-4981   ° °Mobile Crisis Teams °Organization         Address  Phone  Notes  °[facility], Mobile Crisis Care Unit  1-877-626-1772   °Assertive °[facility] ° [address] [place], [place] 336-834-9664   °Sharon DeEsch [address] °[place] 336-554-5454   ° °Self-Help/Support Groups °Organization         Address  Phone             Notes  °[facility] [place] - variety of support groups  336- 373-1402 Call for more information  °Narcotics Anonymous (NA),  [facility] [address], °[place]  2 meetings at this location  ° °Residential Treatment Programs °Organization         Address  Phone  Notes  °[facility] [address],    °[place]  1-866-801-8205   °[facility] ° [address], [place], [place] [phone]   °[facility] [address], [place] 336-845-3988 Admissions: 8am-3pm M-F  °[facility] [address]    °[place], [place] 336-841-1104   °[facility] [address], [place], [place] 336-379-7146   °[facility] [address]  °[place], [place] 336-285-9073   °[facility] - Intensive Outpatient [address], [place], [place] 336-852-3033   °[facility] ([facility] [address]  °[place], [place] 1-877-615-2722 or 336-784-9470   °[facility] ([facility]) [address] [place], [place] 336-227-7417 Accepts Medicaid  °[facility] [address]  °[place] 1-800-659-3381 Substance Abuse/Addiction Treatment  ° °[place] Behavior[facility] Health Resources °Organization         Address  Phone  Notes  °[facility]  (888) [phone]   °Julie Brannon, PhD [address] [place], [place]   (336) 349-5553 or (336) 951-0000   °[facility]   [address] °[place], [employer] (336) 349-4454   °[facility] [address], [place], [place] (336) 342-8316 Insurance/Medicaid/sponsorship through [facility]  °[facility] [address]                                    [place], [place] (336) [phone] Therapy/tele-psych/case  °[facility] [address]  ° [place], [place] (336) 349-2233    °Dr. Arfeen  (336) 349-4544   °[facility] [place]  [employer] [place] [facility] 1) [address], [place] °2) [address], [place] °3)  [address], [place] (336) 349-3220 °(336) 342-7768 ° °(336) 342-8140    °[place] Child Abuse Hotline (336) 342-1394 or (336) 342-3537 (After Hours)    ° ° ° °

## 2013-09-22 NOTE — ED Provider Notes (Signed)
CSN: [id number]     Arrival date & time 09/22/13  0919 History   First MD Initiated Contact with Patient 09/22/13 [phone]     Chief Complaint  Patient presents with  . Nasal Congestion  . Fever  . Cough     (Consider location/radiation/quality/duration/timing/severity/associated sxs/prior Treatment) The history is provided by the patient and the spouse.    Patient reports he was sick 3 days ago with "the flu" (body aches, chills, cough, sore throat) states he has never gotten completely better, continues to cough, and last night became short of breath with worsening cough and soreness in his chest.  Cough is nonproductive.  Has been taking OTC cough and cold medications without improvement.  Reports fever last night but doesn't remember the number.  Has environmental allergies with sneezing and eye watering.  Denies leg swelling.  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  No hx blood clots.    Past Medical History  Diagnosis Date  . GERD (gastroesophageal reflux disease)   . Hypertension    Past Surgical History  Procedure Laterality Date  . Esophageal dilation     No family history on file. History  Substance Use Topics  . Smoking status: Never Smoker   . Smokeless tobacco: Never Used  . Alcohol Use: No    Review of Systems  Constitutional: Positive for fever.  HENT: Positive for sore throat. Negative for trouble swallowing.   Respiratory: Positive for cough, chest tightness and shortness of breath.   Cardiovascular: Negative for chest pain and leg swelling.  Gastrointestinal: Negative for nausea, vomiting, abdominal pain and diarrhea.  Neurological: Positive for headaches.  All other systems reviewed and are negative.     Allergies  Review of patient's allergies indicates no known allergies.  Home Medications   Prior to Admission medications   Medication Sig Start Date End Date Taking? Authorizing Provider  acetaminophen (TYLENOL) 500 MG tablet Take 1,000 mg  by mouth every 6 (six) hours as needed for mild pain.    Yes Historical Provider, MD  lisinopril-hydrochlorothiazide (PRINZIDE,ZESTORETIC) 20-12.5 MG per tablet Take 1 tablet by mouth daily.   Yes Historical Provider, MD  Multiple Vitamin (MULTIVITAMIN WITH MINERALS) TABS tablet Take 1 tablet by mouth daily.   Yes Historical Provider, MD  ranitidine (ZANTAC) 150 MG tablet Take 150 mg by mouth 2 (two) times daily.   Yes Historical Provider, MD   BP 145/99  Pulse 69  Temp(Src) 98 F (36.7 C) (Oral)  Resp 16  SpO2 99% Physical Exam  Nursing note and vitals reviewed. Constitutional: He appears well-developed and well-nourished. No distress.  Neck: Neck supple.  Cardiovascular: Normal rate and regular rhythm.   Pulmonary/Chest: Effort normal and breath sounds normal. No accessory muscle usage. Not tachypneic. No respiratory distress. He has no decreased breath sounds. He has no rhonchi. He has no rales. He exhibits no tenderness.  Initial wheeze in LUL that cleared with cough. Coughing   Abdominal: Soft. He exhibits no distension. There is no tenderness. There is no rebound and no guarding.  Neurological: He is alert.  Skin: He is not diaphoretic.    ED Course  Procedures (including critical care time) Labs Review Labs Reviewed - No data to display  Imaging Review Dg Chest 2 View  09/22/2013   CLINICAL DATA:  Cough.  Fever.  EXAM: CHEST  2 VIEW  COMPARISON:  August 27, 2010.  FINDINGS: The heart size and mediastinal contours are within normal limits. Both lungs are  clear. No pneumothorax or pleural effusion is noted. The visualized skeletal structures are unremarkable.  IMPRESSION: No acute cardiopulmonary abnormality seen.   Electronically Signed   By: Roque LiasJames  Green M.D.   On: 09/22/2013 10:31     EKG Interpretation None      11:28 AM Pt reports improvement after neb, requests another.  Moving air well in all fields, ronchi in LLL that clears with continued deep inspiration.  Wife  now bedside, states fever last night was to 103. Pt denies any recent international travel.   12:26 PM Pt reports great improvement.  Persistent mild irregularity in left lower lobe.    Filed Vitals:   09/22/13 1301  BP: 132/81  Pulse: 83  Temp: 98.8 F (37.1 C)  Resp: 18     MDM   Final diagnoses:  Bronchitis    Pt with influenza-like illness 3 weeks ago that never completely resolved, now with worsening cough, SOB, and chest soreness.  Pt given albuterol neb, ibuprofen for pain (pt is driving).  CXR negative.  Persistent irregular sound on left and pt with fever to 103 last night.  Will cover with antibiotics.  D/C home with albuterol, hycodan, z-pak, and prednisone.   Discussed result, findings, treatment, and follow up  with patient.  Pt given return precautions.  Pt verbalizes understanding and agrees with plan.        Stony PointEmily Arwilda Georgia, PA-C 09/22/13 [phone]

## 2013-09-23 NOTE — ED Provider Notes (Signed)
Medical screening examination/treatment/procedure(s) were performed by non-physician practitioner and as supervising physician I was immediately available for consultation/collaboration.    Linwood Dibbles, MD 09/23/13 [phone]

## 2013-11-22 ENCOUNTER — Encounter (HOSPITAL_COMMUNITY): Payer: Self-pay | Admitting: Emergency Medicine

## 2013-11-22 ENCOUNTER — Emergency Department (HOSPITAL_COMMUNITY)
Admission: EM | Admit: 2013-11-22 | Discharge: 2013-11-22 | Discharge status: Against Medical Advice | Payer: No Typology Code available for payment source | Attending: Emergency Medicine | Admitting: Emergency Medicine

## 2013-11-22 DIAGNOSIS — R109 Unspecified abdominal pain: Secondary | ICD-10-CM | POA: Insufficient documentation

## 2013-11-22 NOTE — ED Notes (Signed)
Pt states he has pain which started in his lower abdomen and now radiates down to his testicles. Pt states the pain is primarily in his testicles now. Symptoms x 1 hr PTA. Pt states he took an Catering managerAlka Seltzer, but it didn't help. Pt states he has the urge to urinate, but has been unable to. Pt alert, no acute distress. Skin warm and dry.

## 2013-11-22 NOTE — ED Notes (Signed)
Pt states his pain is now gone and will return if pain comes back.

## 2014-05-24 ENCOUNTER — Encounter (HOSPITAL_BASED_OUTPATIENT_CLINIC_OR_DEPARTMENT_OTHER): Payer: Self-pay | Admitting: *Deleted

## 2014-05-24 ENCOUNTER — Emergency Department (HOSPITAL_BASED_OUTPATIENT_CLINIC_OR_DEPARTMENT_OTHER)
Admission: EM | Admit: 2014-05-24 | Discharge: 2014-05-24 | Disposition: A | Discharge status: Home / Self Care | Payer: No Typology Code available for payment source | Attending: Emergency Medicine | Admitting: Emergency Medicine

## 2014-05-24 ENCOUNTER — Emergency Department (HOSPITAL_BASED_OUTPATIENT_CLINIC_OR_DEPARTMENT_OTHER): Payer: No Typology Code available for payment source

## 2014-05-24 DIAGNOSIS — Y9389 Activity, other specified: Secondary | ICD-10-CM | POA: Diagnosis not present

## 2014-05-24 DIAGNOSIS — K219 Gastro-esophageal reflux disease without esophagitis: Secondary | ICD-10-CM | POA: Insufficient documentation

## 2014-05-24 DIAGNOSIS — Z7982 Long term (current) use of aspirin: Secondary | ICD-10-CM | POA: Diagnosis not present

## 2014-05-24 DIAGNOSIS — I10 Essential (primary) hypertension: Secondary | ICD-10-CM | POA: Diagnosis not present

## 2014-05-24 DIAGNOSIS — W000XXA Fall on same level due to ice and snow, initial encounter: Secondary | ICD-10-CM | POA: Diagnosis not present

## 2014-05-24 DIAGNOSIS — M20012 Mallet finger of left finger(s): Secondary | ICD-10-CM | POA: Insufficient documentation

## 2014-05-24 DIAGNOSIS — Y9289 Other specified places as the place of occurrence of the external cause: Secondary | ICD-10-CM | POA: Diagnosis not present

## 2014-05-24 DIAGNOSIS — S6992XA Unspecified injury of left wrist, hand and finger(s), initial encounter: Secondary | ICD-10-CM | POA: Diagnosis present

## 2014-05-24 DIAGNOSIS — Z79899 Other long term (current) drug therapy: Secondary | ICD-10-CM | POA: Insufficient documentation

## 2014-05-24 DIAGNOSIS — W19XXXA Unspecified fall, initial encounter: Secondary | ICD-10-CM

## 2014-05-24 DIAGNOSIS — Y998 Other external cause status: Secondary | ICD-10-CM | POA: Insufficient documentation

## 2014-05-24 MED ORDER — IBUPROFEN 800 MG PO TABS
800.0000 mg | ORAL_TABLET | Freq: Once | ORAL | Status: AC
  Administered 2014-05-24: 800 mg via ORAL
  Filled 2014-05-24: qty 1

## 2014-05-24 NOTE — Discharge Instructions (Signed)
It is very important feet keep the splint in place so that your finger remain straight until follow-up with specialist or for 6 weeks.  If you were given medicines take as directed.  If you are on coumadin or contraceptives realize their levels and effectiveness is altered by many different medicines.  If you have any reaction (rash, tongues swelling, other) to the medicines stop taking and see a physician.   Please follow up as directed and return to the ER or see a physician for new or worsening symptoms.  Thank you. Filed Vitals:   05/24/14 1052  BP: 140/100  Pulse: 67  Temp: 98.3 F (36.8 C)  TempSrc: Oral  Resp: 18  Height: 5\' 6"  (1.676 m)  Weight: 180 lb (81.647 kg)  SpO2: 100%    Mallet Finger A mallet, or jammed, finger occurs when the end of a straightened finger or thumb receives a blow (often from a ball). This causes a disruption (tearing) of the extensor tendon (cord like structure which attaches muscle to bone) that straightens the end of your finger. The last joint in your finger will droop and you cannot extend it. Sometimes this is associated with a small fracture (break in bone) of the base of the end bone (phalanx) in your finger. It usually takes 4 to 5 weeks to heal. HOME CARE INSTRUCTIONS   Apply ice to the sore finger for 15-20 minutes, 03-04 times per day for 2 days. Put the ice in a plastic bag and place a towel between the bag of ice and your skin.  If you have a finger splint, wear your splint as directed.  You may remove the splint to wash your finger or as directed.  If your splint is off, do not try to bend the tip of your finger.  Put your splint back on as soon as possible. If your finger is numb or tingling, the splint is probably too tight. You can loosen it so it is comfortable.  Move the part of your injured finger that is not covered by the splint several times a day.  Take medications as directed by your caregiver. Only take over-the-counter or  prescription medicines for pain, discomfort, or fever as directed by your caregiver.  IMPORTANT: follow up with your caregiver or keep or call for any appointments with specialists as directed. The failure to follow up could result in chronic pain and / or disability. SEEK MEDICAL CARE IF:   You have increased pain or swelling.  You notice coldness of your finger.  After treatment you still cannot extend your finger. SEEK IMMEDIATE MEDICAL CARE IF:  Your finger is swollen and very red, white, blue, numb, cold, or tingling. MAKE SURE YOU:   Understand these instructions.  Will watch your condition.  Will get help right away if you are not doing well or get worse. Document Released: 04/14/2000 Document Revised: 09/01/2013 Document Reviewed: 11/29/2007 [facility] Patient Information 2015 [place], [place]. This information is not intended to replace advice given to you by your health care provider. Make sure you discuss any questions you have with your health care provider.

## 2014-05-24 NOTE — ED Provider Notes (Signed)
CSN: [id number]     Arrival date & time 05/24/14  1038 History   First MD Initiated Contact with Patient 05/24/14 1050     Chief Complaint  Patient presents with  . Hand Injury     (Consider location/radiation/quality/duration/timing/severity/associated sxs/prior Treatment) HPI Comments: 54 year old male with no significant medical history presents with left finger pain since falling on the ice and having direct impact to the distal aspect of the fingers small and ring. Pain with palpation range of motion worse at the ring finger. Patient feels she is unable to straighten the distal aspect of the finger. No other injuries.  Patient is a 54 y.o. male presenting with hand injury. The history is provided by the patient.  Hand Injury   Past Medical History  Diagnosis Date  . GERD (gastroesophageal reflux disease)   . Hypertension    Past Surgical History  Procedure Laterality Date  . Esophageal dilation     No family history on file. History  Substance Use Topics  . Smoking status: Never Smoker   . Smokeless tobacco: Never Used  . Alcohol Use: No    Review of Systems  Musculoskeletal: Negative for joint swelling.  Skin: Negative for wound.  Neurological: Negative for syncope and headaches.      Allergies  Review of patient's allergies indicates no known allergies.  Home Medications   Prior to Admission medications   Medication Sig Start Date End Date Taking? Authorizing Provider  lisinopril-hydrochlorothiazide (PRINZIDE,ZESTORETIC) 20-12.5 MG per tablet Take 1 tablet by mouth daily.   Yes Historical Provider, MD  Multiple Vitamin (MULTIVITAMIN WITH MINERALS) TABS tablet Take 1 tablet by mouth daily.   Yes Historical Provider, MD  ranitidine (ZANTAC) 150 MG tablet Take 150 mg by mouth 2 (two) times daily.   Yes Historical Provider, MD  aspirin-sod bicarb-citric acid (ALKA-SELTZER) 325 MG TBEF tablet Take 325 mg by mouth daily.    Historical Provider, MD  ibuprofen  (ADVIL,MOTRIN) 200 MG tablet Take 400 mg by mouth every 6 (six) hours as needed for moderate pain.    Historical Provider, MD   BP 140/100 mmHg  Pulse 67  Temp(Src) 98.3 F (36.8 C) (Oral)  Resp 18  Ht 5\' 6"  (1.676 m)  Wt 180 lb (81.647 kg)  BMI 29.07 kg/m2  SpO2 100% Physical Exam  Constitutional: He appears well-developed and well-nourished.  Cardiovascular: Normal rate.   Pulmonary/Chest: Effort normal.  Musculoskeletal: He exhibits tenderness.  Patient has tenderness between MCP and PIP of ring finger left hand. Patient unable to straighten distal aspect of ring finger at DIP. Clinically concern for mallet finger. No open wounds. No other focal hand or wrist tenderness the left side.  Neurological: He is alert.  Nursing note and vitals reviewed.   ED Course  Procedures (including critical care time) Labs Review Labs Reviewed - No data to display  Imaging Review Dg Hand Complete Left  05/24/2014   CLINICAL DATA:  Fall yesterday with left hand injury and pain. Initial encounter.  EXAM: LEFT HAND - COMPLETE 3+ VIEW  COMPARISON:  None.  FINDINGS: No acute fracture or dislocation is identified. Mild osteoarthritis noted involving interphalangeal joints. No bony lesions or destruction. Soft tissues are unremarkable.  IMPRESSION: No acute fracture identified.   Electronically Signed   By: Irish LackGlenn  Yamagata M.D.   On: 05/24/2014 11:22     EKG Interpretation None      MDM   Final diagnoses:  Mallet finger of left hand   Clinically concern for  bone contusion versus occult fracture as well as mallet finger. Plan for x-ray and splint. X-ray reviewed, follow-up with orthopedic hand surgeon discussed.  X-ray reviewed possible small avulsion fracture proximal to DIP, no significant fracture, normal alignment.  Distal finger splint placed, stressed leaving distal aspect straight and splint in place until follow-up with specialist. Results and differential diagnosis were discussed  with the patient/parent/guardian. Close follow up outpatient was discussed, comfortable with the plan.   Medications  ibuprofen (ADVIL,MOTRIN) tablet 800 mg (800 mg Oral Given 05/24/14 1119)    Filed Vitals:   05/24/14 1052  BP: 140/100  Pulse: 67  Temp: 98.3 F (36.8 C)  TempSrc: Oral  Resp: 18  Height:  (1.676 m)  Weight: 180 lb (81.647 kg)  SpO2: 100%    Final diagnoses:  Mallet finger of left hand        Enid Skeens, MD 05/24/14 1127

## 2014-05-24 NOTE — ED Notes (Signed)
States fell on snow yesterday am. Swelling noted to back of left hand and to ring finger and pinkie finger of left hand. Is able to move fingers and hand. NL pulses and nl cap refill.

## 2014-05-27 ENCOUNTER — Emergency Department (HOSPITAL_BASED_OUTPATIENT_CLINIC_OR_DEPARTMENT_OTHER)
Admission: EM | Admit: 2014-05-27 | Discharge: 2014-05-27 | Disposition: A | Discharge status: Home / Self Care | Payer: No Typology Code available for payment source | Attending: Emergency Medicine | Admitting: Emergency Medicine

## 2014-05-27 ENCOUNTER — Encounter (HOSPITAL_BASED_OUTPATIENT_CLINIC_OR_DEPARTMENT_OTHER): Payer: Self-pay

## 2014-05-27 DIAGNOSIS — S6992XD Unspecified injury of left wrist, hand and finger(s), subsequent encounter: Secondary | ICD-10-CM | POA: Diagnosis present

## 2014-05-27 DIAGNOSIS — K219 Gastro-esophageal reflux disease without esophagitis: Secondary | ICD-10-CM | POA: Insufficient documentation

## 2014-05-27 DIAGNOSIS — I10 Essential (primary) hypertension: Secondary | ICD-10-CM | POA: Diagnosis not present

## 2014-05-27 DIAGNOSIS — Z79899 Other long term (current) drug therapy: Secondary | ICD-10-CM | POA: Insufficient documentation

## 2014-05-27 DIAGNOSIS — X58XXXD Exposure to other specified factors, subsequent encounter: Secondary | ICD-10-CM | POA: Insufficient documentation

## 2014-05-27 NOTE — ED Provider Notes (Signed)
CSN: [id number]     Arrival date & time 05/27/14  1608 History   First MD Initiated Contact with Patient 05/27/14 1644     Chief Complaint  Patient presents with  . Finger Injury     (Consider location/radiation/quality/duration/timing/severity/associated sxs/prior Treatment) The history is provided by the patient. No language interpreter was used.  Patient presents to tthe ED after injury sustained  To the hand on 05/24/2014, He was diagnosed with mallet finger. He has not yet followed up with the outpatient specialist. Patient states that he needs a note to be released badk to work. He is asking for a splint theat does not cover the end of his distal finger.Denies fevers, chills, myalgias, arthralgias. Denies DOE, SOB, chest tightness or pressure, radiation to left arm, jaw or back, or diaphoresis. Denies dysuria, flank pain, suprapubic pain, frequency, urgency, or hematuria. Denies headaches, light headedness, weakness, visual disturbances. Denies abdominal pain, nausea, vomiting, diarrhea or constipation.    Past Medical History  Diagnosis Date  . GERD (gastroesophageal reflux disease)   . Hypertension    Past Surgical History  Procedure Laterality Date  . Esophageal dilation     No family history on file. History  Substance Use Topics  . Smoking status: Never Smoker   . Smokeless tobacco: Never Used  . Alcohol Use: No    Review of Systems  Ten systems reviewed and are negative for acute change, except as noted in the HPI.    Allergies  Review of patient's allergies indicates no known allergies.  Home Medications   Prior to Admission medications   Medication Sig Start Date End Date Taking? Authorizing Provider  aspirin-sod bicarb-citric acid (ALKA-SELTZER) 325 MG TBEF tablet Take 325 mg by mouth daily.    Historical Provider, MD  ibuprofen (ADVIL,MOTRIN) 200 MG tablet Take 400 mg by mouth every 6 (six) hours as needed for moderate pain.    Historical Provider, MD   lisinopril-hydrochlorothiazide (PRINZIDE,ZESTORETIC) 20-12.5 MG per tablet Take 1 tablet by mouth daily.    Historical Provider, MD  Multiple Vitamin (MULTIVITAMIN WITH MINERALS) TABS tablet Take 1 tablet by mouth daily.    Historical Provider, MD  ranitidine (ZANTAC) 150 MG tablet Take 150 mg by mouth 2 (two) times daily.    Historical Provider, MD   BP 148/91 mmHg  Pulse 78  Temp(Src) 98.4 F (36.9 C) (Oral)  Resp 16  SpO2 100% Physical Exam Physical Exam  Constitutional: He appears well-developed and well-nourished.  Cardiovascular: Normal rate.  Pulmonary/Chest: Effort normal.  Musculoskeletal: He exhibits tenderness.  Patient has tenderness between MCP and PIP of ring finger left hand. Patient unable to straighten distal aspect of ring finger at DIP. Clinically concern for mallet finger. No open wounds. No other focal hand or wrist tenderness the left side.  Neurological: He is alert.  Nursing note and vitals reviewed.   ED Course  Procedures (including critical care time) Labs Review Labs Reviewed - No data to display  Imaging Review No results found.   EKG Interpretation None      MDM   Final diagnoses:  Finger injury, left, subsequent encounter    Patient given return to work note and new finger splint.  Follow with sports med physician.     Arthor CaptainAbigail Jaydynn Wolford, PA-C 05/30/14 [phone]  Vanetta MuldersScott Zackowski, MD 05/30/14 2154

## 2014-05-27 NOTE — ED Notes (Signed)
Left ring finger injury 1/24-was seen here-splint in place-states he did not f/u with referral-requesting a RTW note and new splint that he can work in

## 2014-05-27 NOTE — Discharge Instructions (Signed)
Please follow up with the hand specialist.  Fingertip Injuries and Amputations Fingertip injuries are common and often get injured because they are last to escape when pulling your hand out of harm's way. You have amputated (cut off) part of your finger. How this turns out depends largely on how much was amputated. If just the tip is amputated, often the end of the finger will grow back and the finger may return to much the same as it was before the injury.  If more of the finger is missing, your caregiver has done the best with the tissue remaining to allow you to keep as much finger as is possible. Your caregiver after checking your injury has tried to leave you with a painless fingertip that has durable, feeling skin. If possible, your caregiver has tried to maintain the finger's length and appearance and preserve its fingernail.  Please read the instructions outlined below and refer to this sheet in the next few weeks. These instructions provide you with general information on caring for yourself. Your caregiver may also give you specific instructions. While your treatment has been done according to the most current medical practices available, unavoidable complications occasionally occur. If you have any problems or questions after discharge, please call your caregiver. HOME CARE INSTRUCTIONS   You may resume normal diet and activities as directed or allowed.  Keep your hand elevated above the level of your heart. This helps decrease pain and swelling.  Keep ice packs (or a bag of ice wrapped in a towel) on the injured area for 15-20 minutes, 03-04 times per day, for the first two days.  Change dressings if necessary or as directed.  Clean the wound daily or as directed.  Only take over-the-counter or prescription medicines for pain, discomfort, or fever as directed by your caregiver.  Keep appointments as directed. SEEK IMMEDIATE MEDICAL CARE IF:  You develop redness, swelling, numbness  or increasing pain in the wound.  There is pus coming from the wound.  You develop an unexplained oral temperature above 102 F (38.9 C) or as your caregiver suggests.  There is a foul (bad) smell coming from the wound or dressing.  There is a breaking open of the wound (edges not staying together) after sutures or staples have been removed. MAKE SURE YOU:   Understand these instructions.  Will watch your condition.  Will get help right away if you are not doing well or get worse. Document Released: 03/08/2005 Document Revised: 07/10/2011 Document Reviewed: 02/05/2008 [facility] Patient Information 2015 [place], [place]. This information is not intended to replace advice given to you by your health care provider. Make sure you discuss any questions you have with your health care provider.

## 2014-12-28 ENCOUNTER — Emergency Department (HOSPITAL_COMMUNITY)
Admission: EM | Admit: 2014-12-28 | Discharge: 2014-12-28 | Disposition: A | Discharge status: Home / Self Care | Payer: No Typology Code available for payment source | Attending: Emergency Medicine | Admitting: Emergency Medicine

## 2014-12-28 ENCOUNTER — Encounter (HOSPITAL_COMMUNITY): Payer: Self-pay

## 2014-12-28 ENCOUNTER — Emergency Department (HOSPITAL_COMMUNITY): Payer: No Typology Code available for payment source

## 2014-12-28 DIAGNOSIS — R110 Nausea: Secondary | ICD-10-CM | POA: Insufficient documentation

## 2014-12-28 DIAGNOSIS — R61 Generalized hyperhidrosis: Secondary | ICD-10-CM | POA: Insufficient documentation

## 2014-12-28 DIAGNOSIS — I10 Essential (primary) hypertension: Secondary | ICD-10-CM | POA: Insufficient documentation

## 2014-12-28 DIAGNOSIS — R1031 Right lower quadrant pain: Secondary | ICD-10-CM | POA: Insufficient documentation

## 2014-12-28 DIAGNOSIS — Z79899 Other long term (current) drug therapy: Secondary | ICD-10-CM | POA: Insufficient documentation

## 2014-12-28 DIAGNOSIS — N2889 Other specified disorders of kidney and ureter: Secondary | ICD-10-CM | POA: Insufficient documentation

## 2014-12-28 DIAGNOSIS — R109 Unspecified abdominal pain: Secondary | ICD-10-CM

## 2014-12-28 DIAGNOSIS — K219 Gastro-esophageal reflux disease without esophagitis: Secondary | ICD-10-CM | POA: Insufficient documentation

## 2014-12-28 LAB — URINALYSIS, ROUTINE W REFLEX MICROSCOPIC
Bilirubin Urine: NEGATIVE
Glucose, UA: NEGATIVE mg/dL
Hgb urine dipstick: NEGATIVE
KETONES UR: NEGATIVE mg/dL
Leukocytes, UA: NEGATIVE
NITRITE: NEGATIVE
Protein, ur: NEGATIVE mg/dL
Specific Gravity, Urine: 1.012 (ref 1.005–1.030)
Urobilinogen, UA: 0.2 mg/dL (ref 0.0–1.0)
pH: 7.5 (ref 5.0–8.0)

## 2014-12-28 LAB — COMPREHENSIVE METABOLIC PANEL
ALK PHOS: 61 U/L (ref 38–126)
ALT: 32 U/L (ref 17–63)
AST: 29 U/L (ref 15–41)
Albumin: 4.2 g/dL (ref 3.5–5.0)
Anion gap: 6 (ref 5–15)
BILIRUBIN TOTAL: 0.6 mg/dL (ref 0.3–1.2)
BUN: 16 mg/dL (ref 6–20)
CALCIUM: 9.5 mg/dL (ref 8.9–10.3)
CO2: 27 mmol/L (ref 22–32)
CREATININE: 1.16 mg/dL (ref 0.61–1.24)
Chloride: 101 mmol/L (ref 101–111)
GFR calc Af Amer: >60 mL/min (ref >60)
Glucose, Bld: 104 mg/dL — ABNORMAL HIGH (ref 65–99)
Potassium: 4 mmol/L (ref 3.5–5.1)
Sodium: 134 mmol/L — ABNORMAL LOW (ref 135–145)
Total Protein: 7.2 g/dL (ref 6.5–8.1)

## 2014-12-28 LAB — CBC
HCT: 44.5 % (ref 39.0–52.0)
Hemoglobin: 15.9 g/dL (ref 13.0–17.0)
MCH: 32.1 pg (ref 26.0–34.0)
MCHC: 35.7 g/dL (ref 30.0–36.0)
MCV: 89.9 fL (ref 78.0–100.0)
PLATELETS: 286 10*3/uL (ref 150–400)
RBC: 4.95 MIL/uL (ref 4.22–5.81)
RDW: 12.8 % (ref 11.5–15.5)
WBC: 8.4 10*3/uL (ref 4.0–10.5)

## 2014-12-28 LAB — LIPASE, BLOOD: Lipase: 32 U/L (ref 22–51)

## 2014-12-28 MED ORDER — HYDROCODONE-ACETAMINOPHEN 5-325 MG PO TABS: 1.0000 | ORAL_TABLET | Freq: Four times a day (QID) | ORAL | Status: DC | PRN

## 2014-12-28 MED ORDER — ONDANSETRON HCL 4 MG/2ML IJ SOLN
4.0000 mg | Freq: Once | INTRAMUSCULAR | Status: AC
  Administered 2014-12-28: 4 mg via INTRAVENOUS
  Filled 2014-12-28: qty 2

## 2014-12-28 MED ORDER — IBUPROFEN 600 MG PO TABS: 600.0000 mg | ORAL_TABLET | Freq: Four times a day (QID) | ORAL | Status: DC | PRN

## 2014-12-28 MED ORDER — HYDROMORPHONE HCL 1 MG/ML IJ SOLN
0.5000 mg | Freq: Once | INTRAMUSCULAR | Status: AC
  Administered 2014-12-28: 0.5 mg via INTRAVENOUS
  Filled 2014-12-28: qty 1

## 2014-12-28 NOTE — ED Notes (Signed)
Pt was provided urinal. Pt stated he didn't have to go but he would try. Will notify when able to void.

## 2014-12-28 NOTE — Discharge Instructions (Signed)
Your CT scan did not show explanation for your pain that she had earlier. It is possible that he passed a kidney stone. He did have incidental finding of a mass to the left kidney, it is very small, 8 mm. It will however need further evaluation. Please follow-up with either primary care doctor or urology for further testing. Return if worsening symptoms. Pain medications as needed.   Flank Pain Flank pain refers to pain that is located on the side of the body between the upper abdomen and the back. The pain may occur over a short period of time (acute) or may be long-term or reoccurring (chronic). It may be mild or severe. Flank pain can be caused by many things. CAUSES  Some of the more common causes of flank pain include:  Muscle strains.   Muscle spasms.   A disease of your spine (vertebral disk disease).   A lung infection (pneumonia).   Fluid around your lungs (pulmonary edema).   A kidney infection.   Kidney stones.   A very painful skin rash caused by the chickenpox virus (shingles).   Gallbladder disease.  HOME CARE INSTRUCTIONS  Home care will depend on the cause of your pain. In general,  Rest as directed by your caregiver.  Drink enough fluids to keep your urine clear or pale yellow.  Only take over-the-counter or prescription medicines as directed by your caregiver. Some medicines may help relieve the pain.  Tell your caregiver about any changes in your pain.  Follow up with your caregiver as directed. SEEK IMMEDIATE MEDICAL CARE IF:   Your pain is not controlled with medicine.   You have new or worsening symptoms.  Your pain increases.   You have abdominal pain.   You have shortness of breath.   You have persistent nausea or vomiting.   You have swelling in your abdomen.   You feel faint or pass out.   You have blood in your urine.  You have a fever or persistent symptoms for more than 2-3 days.  You have a fever and your symptoms  suddenly get worse. MAKE SURE YOU:   Understand these instructions.  Will watch your condition.  Will get help right away if you are not doing well or get worse. Document Released: 06/08/2005 Document Revised: 01/10/2012 Document Reviewed: 11/30/2011 [facility] Patient Information 2015 [place], [place]. This information is not intended to replace advice given to you by your health care provider. Make sure you discuss any questions you have with your health care provider.

## 2014-12-28 NOTE — ED Provider Notes (Signed)
CSN: [id number]     Arrival date & time 12/28/14  4098 History   First MD Initiated Contact with Patient 12/28/14 1001     Chief Complaint  Patient presents with  . Abdominal Pain     (Consider location/radiation/quality/duration/timing/severity/associated sxs/prior Treatment) HPI [name] is a 54 y.o. male with history of GERD, hypertension, presents to emergency department complaining of sudden onset of right-sided flank pain. Patient states it woke him up from sleep. States pain is sharp, burning. Radiates into the right lower abdomen. He reports that he is having associated nausea, and earlier had diaphoresis. He denies similar pain in the past. He denies any urinary symptoms. No changes in bowels or vomiting. No fever or chills. Took ibuprofen prior to coming in. No hx of the same  Past Medical History  Diagnosis Date  . GERD (gastroesophageal reflux disease)   . Hypertension    Past Surgical History  Procedure Laterality Date  . Esophageal dilation     Family History  Problem Relation Age of Onset  . Hypertension Mother   . Diabetes Mother   . Hypertension Father    Social History  Substance Use Topics  . Smoking status: Never Smoker   . Smokeless tobacco: Never Used  . Alcohol Use: No    Review of Systems  Constitutional: Positive for diaphoresis. Negative for fever and chills.  Respiratory: Negative for cough, chest tightness and shortness of breath.   Cardiovascular: Negative for chest pain, palpitations and leg swelling.  Gastrointestinal: Positive for nausea and abdominal pain. Negative for vomiting, diarrhea and abdominal distention.  Genitourinary: Positive for flank pain. Negative for dysuria, urgency, frequency, hematuria, discharge, penile swelling, scrotal swelling, penile pain and testicular pain.  Musculoskeletal: Negative for myalgias, arthralgias, neck pain and neck stiffness.  Skin: Negative for rash.  Allergic/Immunologic: Negative for  immunocompromised state.  Neurological: Negative for dizziness, weakness, light-headedness, numbness and headaches.      Allergies  Review of patient's allergies indicates no known allergies.  Home Medications   Prior to Admission medications   Medication Sig Start Date End Date Taking? Authorizing Provider  Capsaicin (CAPZASIN-HP) 0.1 % CREA Apply 1 application topically daily as needed (for pain).   Yes Historical Provider, MD  cetirizine (ZYRTEC) 10 MG tablet Take 10 mg by mouth daily as needed for allergies.   Yes Historical Provider, MD  ibuprofen (ADVIL,MOTRIN) 200 MG tablet Take 400 mg by mouth every 6 (six) hours as needed for moderate pain.   Yes Historical Provider, MD  lisinopril-hydrochlorothiazide (PRINZIDE,ZESTORETIC) 20-12.5 MG per tablet Take 1 tablet by mouth daily.   Yes Historical Provider, MD  Multiple Vitamin (MULTIVITAMIN WITH MINERALS) TABS tablet Take 1 tablet by mouth daily.   Yes Historical Provider, MD  Omega-3 Fatty Acids (OMEGA 3 PO) Take 1 capsule by mouth daily.   Yes Historical Provider, MD  omeprazole (PRILOSEC) 20 MG capsule Take 20 mg by mouth daily.   Yes Historical Provider, MD  vitamin C (ASCORBIC ACID) 500 MG tablet Take 500 mg by mouth daily.   Yes Historical Provider, MD   BP 118/80 mmHg  Pulse 66  Temp(Src) 97.7 F (36.5 C) (Oral)  Resp 16  SpO2 99% Physical Exam  Constitutional: He appears well-developed and well-nourished. No distress.  HENT:  Head: Normocephalic and atraumatic.  Eyes: Conjunctivae are normal.  Neck: Neck supple.  Cardiovascular: Normal rate, regular rhythm and normal heart sounds.   Pulmonary/Chest: Effort normal. No respiratory distress. He has no wheezes. He has no  rales.  Abdominal: Soft. Bowel sounds are normal. He exhibits no distension. There is tenderness. There is no rebound.  RLQ tenderness, right CVA tenderness  Musculoskeletal: He exhibits no edema.  Neurological: He is alert.  Skin: Skin is warm and dry.   Nursing note and vitals reviewed.   ED Course  Procedures (including critical care time) Labs Review Labs Reviewed  COMPREHENSIVE METABOLIC PANEL - Abnormal; Notable for the following:    Sodium 134 (*)    Glucose, Bld 104 (*)    All other components within normal limits  LIPASE, BLOOD  CBC  URINALYSIS, ROUTINE W REFLEX MICROSCOPIC (NOT AT [facility])    Imaging Review Ct Renal Stone Study  12/28/2014   CLINICAL DATA:  Right flank pain since this morning  EXAM: CT ABDOMEN AND PELVIS WITHOUT CONTRAST  TECHNIQUE: Multidetector CT imaging of the abdomen and pelvis was performed following the standard protocol without IV contrast.  COMPARISON:  07/16/2012  FINDINGS: BODY WALL: No contributory findings.  LOWER CHEST: Chronically patulous lower esophagus with fluid retention. Changes of Nissen fundoplication.  ABDOMEN/PELVIS:  Liver: No focal abnormality.  Biliary: No evidence of biliary obstruction or stone.  Pancreas: Unremarkable.  Spleen: Unremarkable.  Adrenals: Unremarkable.  Kidneys and ureters: No hydronephrosis or stone. Ovoid low-density lesion in the interpolar right kidney measuring 12 mm. 14 mm nearly isoechoic lesion in the upper pole left kidney. These lesions were characterized as cysts on 2014 abdominal MRI, the larger hemorrhagic.  There is also an 8 mm isodense partly exophytic nodule from the left upper pole which is concerning for a solid neoplasm based on previous enhancement pattern. There has been no growth since prior.  Bladder: Unremarkable.  Reproductive: No pathologic findings.  Bowel: No obstruction. Mild colonic diverticulosis. No appendicitis.  Retroperitoneum: No mass or adenopathy.  Peritoneum: No ascites or pneumoperitoneum.  Vascular: No acute abnormality.  OSSEOUS: Focally advanced degenerative disc disease at L5-S1 with mild-to-moderate biforaminal stenosis.  These results were called by telephone at the time of interpretation on 12/28/2014 at 11:44 am to Dr. Jaynie Crumble , who verbally acknowledged these results.  IMPRESSION: 1. No hydronephrosis or urolithiasis. No explanation for acute abdominal pain. 2. 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 Recommend updated renal MRI and urology referral. 3. 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   Electronically Signed   By: Marnee Spring M.D.   On: 12/28/2014 11:46   I have personally reviewed and evaluated these images and lab results as part of my medical decision-making.   EKG Interpretation None      MDM   Final diagnoses:  Flank pain  Renal mass, left    Patient with sudden onset of right-sided flank pain and right lower abdominal pain, nausea, diaphoresis. Patient's vital signs are normal this time. He is afebrile. His history and exam is most consistent with possible kidney stone. Will get CT renal study, will get labs and urinalysis. Dilaudid and Zofran ordered for his symptoms.  12:43 PM Patient's pain is completely resolved. Labs and ua unremarkable. CT is unremarkable except for persistent left renal mass. Appendix is normal. Patient most likely had a kidney stone that he passed. Discussed with him the finding of the left renal mass, and able give him follow-up with the urologist. Patient is comfortable with going home. I will give her pain medication in case pain comes back and instructed him to follow-up with primary care doctor.   Filed Vitals:  12/28/14 0957  BP: 118/80  Pulse: 66  Temp: 97.7 F (36.5 C)  TempSrc: Oral  Resp: 16  SpO2: 99%     Jaynie Crumble, PA-C 12/28/14 1640  Lyndal Pulley, MD 12/29/14 [phone]

## 2014-12-28 NOTE — ED Notes (Signed)
Patient c/o right upper abdominal pain and nausea.since 0200. Patient denies any V/D.

## 2014-12-28 NOTE — ED Notes (Signed)
PA at bedside.

## 2014-12-28 NOTE — ED Notes (Signed)
Patient transported to CT 

## 2016-01-15 ENCOUNTER — Emergency Department (HOSPITAL_COMMUNITY): Payer: BLUE CROSS/BLUE SHIELD

## 2016-01-15 ENCOUNTER — Emergency Department (HOSPITAL_COMMUNITY)
Admission: EM | Admit: 2016-01-15 | Discharge: 2016-01-15 | Disposition: A | Discharge status: Home / Self Care | Payer: BLUE CROSS/BLUE SHIELD | Attending: Emergency Medicine | Admitting: Emergency Medicine

## 2016-01-15 ENCOUNTER — Encounter (HOSPITAL_COMMUNITY): Payer: Self-pay | Admitting: Emergency Medicine

## 2016-01-15 DIAGNOSIS — R1013 Epigastric pain: Secondary | ICD-10-CM | POA: Insufficient documentation

## 2016-01-15 DIAGNOSIS — Z79899 Other long term (current) drug therapy: Secondary | ICD-10-CM | POA: Insufficient documentation

## 2016-01-15 DIAGNOSIS — R1011 Right upper quadrant pain: Secondary | ICD-10-CM | POA: Insufficient documentation

## 2016-01-15 DIAGNOSIS — I10 Essential (primary) hypertension: Secondary | ICD-10-CM | POA: Diagnosis not present

## 2016-01-15 LAB — COMPREHENSIVE METABOLIC PANEL
ALT: 34 U/L (ref 17–63)
AST: 29 U/L (ref 15–41)
Albumin: 4.2 g/dL (ref 3.5–5.0)
Alkaline Phosphatase: 71 U/L (ref 38–126)
Anion gap: 8 (ref 5–15)
BUN: 16 mg/dL (ref 6–20)
CO2: 26 mmol/L (ref 22–32)
Calcium: 9.8 mg/dL (ref 8.9–10.3)
Chloride: 103 mmol/L (ref 101–111)
Creatinine, Ser: 1.14 mg/dL (ref 0.61–1.24)
GFR calc Af Amer: >60 mL/min (ref >60)
Glucose, Bld: 110 mg/dL — ABNORMAL HIGH (ref 65–99)
POTASSIUM: 3.5 mmol/L (ref 3.5–5.1)
SODIUM: 137 mmol/L (ref 135–145)
Total Bilirubin: 0.8 mg/dL (ref 0.3–1.2)
Total Protein: 7.5 g/dL (ref 6.5–8.1)

## 2016-01-15 LAB — URINALYSIS, ROUTINE W REFLEX MICROSCOPIC
Bilirubin Urine: NEGATIVE
GLUCOSE, UA: NEGATIVE mg/dL
Hgb urine dipstick: NEGATIVE
Ketones, ur: NEGATIVE mg/dL
LEUKOCYTES UA: NEGATIVE
Nitrite: NEGATIVE
Protein, ur: NEGATIVE mg/dL
Specific Gravity, Urine: 1.011 (ref 1.005–1.030)
pH: 7 (ref 5.0–8.0)

## 2016-01-15 LAB — CBC
HEMATOCRIT: 45.3 % (ref 39.0–52.0)
Hemoglobin: 15.9 g/dL (ref 13.0–17.0)
MCH: 31.7 pg (ref 26.0–34.0)
MCHC: 35.1 g/dL (ref 30.0–36.0)
MCV: 90.4 fL (ref 78.0–100.0)
Platelets: 268 10*3/uL (ref 150–400)
RBC: 5.01 MIL/uL (ref 4.22–5.81)
RDW: 13.5 % (ref 11.5–15.5)
WBC: 12.1 10*3/uL — AB (ref 4.0–10.5)

## 2016-01-15 LAB — LIPASE, BLOOD: LIPASE: 40 U/L (ref 11–51)

## 2016-01-15 MED ORDER — SODIUM CHLORIDE 0.9 % IV BOLUS (SEPSIS)
1000.0000 mL | Freq: Once | INTRAVENOUS | Status: AC
  Administered 2016-01-15: 1000 mL via INTRAVENOUS

## 2016-01-15 MED ORDER — MORPHINE SULFATE (PF) 4 MG/ML IV SOLN
4.0000 mg | Freq: Once | INTRAVENOUS | Status: AC
  Administered 2016-01-15: 4 mg via INTRAVENOUS
  Filled 2016-01-15: qty 1

## 2016-01-15 MED ORDER — ONDANSETRON HCL 4 MG/2ML IJ SOLN
4.0000 mg | Freq: Once | INTRAMUSCULAR | Status: DC | PRN
  Filled 2016-01-15: qty 2

## 2016-01-15 MED ORDER — GI COCKTAIL ~~LOC~~
30.0000 mL | Freq: Once | ORAL | Status: AC
  Administered 2016-01-15: 30 mL via ORAL
  Filled 2016-01-15: qty 30

## 2016-01-15 MED ORDER — ONDANSETRON HCL 4 MG/2ML IJ SOLN
4.0000 mg | Freq: Once | INTRAMUSCULAR | Status: AC
  Administered 2016-01-15: 4 mg via INTRAVENOUS
  Filled 2016-01-15: qty 2

## 2016-01-15 NOTE — ED Triage Notes (Signed)
Pt c/o RUQ ab pain radiating around to right back inferior to right scapula, metallic taste in mouth, smooth brown emesis onset today. No hematemesis, coffee ground emesis, ripping sensation. PCP sent pt here to rule out gallbladder disease.

## 2016-01-15 NOTE — Discharge Instructions (Signed)
Try zantac 150mg twice a day.  ° ° °

## 2016-01-15 NOTE — ED Provider Notes (Signed)
[facility]-EMERGENCY DEPT Provider Note   CSN: [id number] Arrival date & time: 01/15/16  1406     History   Chief Complaint Chief Complaint  Patient presents with  . Abdominal Pain  . Back Pain    HPI [name] is a 55 y.o. male.  55 yo M with a chief complaints of right upper quadrant pain. This started earlier today after eating. Patient was having trouble working because he was having sweats and chills. Has had multiple episodes of vomiting. Pain is right upper abdomen and radiates to his back. Denies prior abdominal surgery.   The history is provided by the patient.  Abdominal Pain   This is a new problem. The current episode started 6 to 12 hours ago. The problem occurs constantly. The problem has not changed since onset.The pain is associated with eating. The pain is located in the RUQ. The pain is at a severity of 9/10. The pain is severe. Associated symptoms include fever, nausea and vomiting. Pertinent negatives include diarrhea, headaches, arthralgias and myalgias. Nothing aggravates the symptoms. Nothing relieves the symptoms.  Back Pain   Associated symptoms include a fever and abdominal pain. Pertinent negatives include no chest pain and no headaches.    Past Medical History:  Diagnosis Date  . GERD (gastroesophageal reflux disease)   . Hypertension     There are no active problems to display for this patient.   Past Surgical History:  Procedure Laterality Date  . ESOPHAGEAL DILATION         Home Medications    Prior to Admission medications   Medication Sig Start Date End Date Taking? Authorizing Provider  Capsaicin (CAPZASIN-HP) 0.1 % CREA Apply 1 application topically daily as needed (for pain).    Historical Provider, MD  cetirizine (ZYRTEC) 10 MG tablet Take 10 mg by mouth daily as needed for allergies.    Historical Provider, MD  HYDROcodone-acetaminophen (NORCO) 5-325 MG per tablet Take 1 tablet by mouth every 6 (six) hours as needed. 12/28/14    Tatyana Kirichenko, PA-C  ibuprofen (ADVIL,MOTRIN) 200 MG tablet Take 400 mg by mouth every 6 (six) hours as needed for moderate pain.    Historical Provider, MD  ibuprofen (ADVIL,MOTRIN) 600 MG tablet Take 1 tablet (600 mg total) by mouth every 6 (six) hours as needed. 12/28/14   Tatyana Kirichenko, PA-C  lisinopril-hydrochlorothiazide (PRINZIDE,ZESTORETIC) 20-12.5 MG per tablet Take 1 tablet by mouth daily.    Historical Provider, MD  Multiple Vitamin (MULTIVITAMIN WITH MINERALS) TABS tablet Take 1 tablet by mouth daily.    Historical Provider, MD  Omega-3 Fatty Acids (OMEGA 3 PO) Take 1 capsule by mouth daily.    Historical Provider, MD  omeprazole (PRILOSEC) 20 MG capsule Take 20 mg by mouth daily.    Historical Provider, MD  vitamin C (ASCORBIC ACID) 500 MG tablet Take 500 mg by mouth daily.    Historical Provider, MD    Family History Family History  Problem Relation Age of Onset  . Hypertension Mother   . Diabetes Mother   . Hypertension Father     Social History Social History  Substance Use Topics  . Smoking status: Never Smoker  . Smokeless tobacco: Never Used  . Alcohol use No     Allergies   Review of patient's allergies indicates no known allergies.   Review of Systems Review of Systems  Constitutional: Positive for chills and fever.  HENT: Negative for congestion and facial swelling.   Eyes: Negative for discharge and visual  disturbance.  Respiratory: Negative for shortness of breath.   Cardiovascular: Negative for chest pain and palpitations.  Gastrointestinal: Positive for abdominal pain, nausea and vomiting. Negative for diarrhea.  Musculoskeletal: Positive for back pain. Negative for arthralgias and myalgias.  Skin: Negative for color change and rash.  Neurological: Negative for tremors, syncope and headaches.  Psychiatric/Behavioral: Negative for confusion and dysphoric mood.     Physical Exam Updated Vital Signs BP (!) 148/105   Pulse 69   Temp  97.9 F (36.6 C) (Oral)   Resp 18   SpO2 100%   Physical Exam  Constitutional: He is oriented to person, place, and time. He appears well-developed and well-nourished.  HENT:  Head: Normocephalic and atraumatic.  Eyes: EOM are normal. Pupils are equal, round, and reactive to light.  Neck: Normal range of motion. Neck supple. No JVD present.  Cardiovascular: Normal rate and regular rhythm.  Exam reveals no gallop and no friction rub.   No murmur heard. Pulmonary/Chest: No respiratory distress. He has no wheezes.  Abdominal: He exhibits no distension (RUQ, + murphys) and no mass. There is tenderness. There is guarding. There is no rebound.  Musculoskeletal: Normal range of motion.  Neurological: He is alert and oriented to person, place, and time.  Skin: No rash noted. No pallor.  Psychiatric: He has a normal mood and affect. His behavior is normal.  Nursing note and vitals reviewed.    ED Treatments / Results  Labs (all labs ordered are listed, but only abnormal results are displayed) Labs Reviewed  COMPREHENSIVE METABOLIC PANEL - Abnormal; Notable for the following:       Result Value   Glucose, Bld 110 (*)    All other components within normal limits  CBC - Abnormal; Notable for the following:    WBC 12.1 (*)    All other components within normal limits  LIPASE, BLOOD  URINALYSIS, ROUTINE W REFLEX MICROSCOPIC (NOT AT [facility])    EKG  EKG Interpretation None       Radiology Dg Chest 2 View  Result Date: 01/15/2016 CLINICAL DATA:  RIGHT side abdominal pain and fever for 1 day, history hypertension and GERD EXAM: CHEST  2 VIEW COMPARISON:  09/22/2013 FINDINGS: Normal heart size, mediastinal contours, and pulmonary vascularity. Mild chronic bronchitic changes. Lungs otherwise clear. No pleural effusion or pneumothorax. Mild degenerative changes and scoliosis thoracic spine. No acute bony abnormalities. IMPRESSION: Chronic bronchitic changes without infiltrate. Electronically  Signed   By: Ulyses SouthwardMark  Boles M.D.   On: 01/15/2016 17:53   [place] Abdomen Limited Ruq  Result Date: 01/15/2016 CLINICAL DATA:  Patient with right upper quadrant pain. EXAM: US ABDOMEN LIMITED - RIGHT UPPER QUADRANT COMPARISON:  CT abdomen pelvis 12/28/2014 FINDINGS: Gallbladder: No gallstones or wall thickening visualized. No sonographic Murphy sign noted by sonographer. Common bile duct: Diameter: 2 mm Liver: Liver is diffusely increased in echogenicity. IMPRESSION: No cholelithiasis or sonographic evidence for acute cholecystitis. Hepatic steatosis. Electronically Signed   By: Annia Beltrew  Davis M.D.   On: 01/15/2016 17:41    Procedures Procedures (including critical care time)  Medications Ordered in ED Medications  ondansetron (ZOFRAN) injection 4 mg (not administered)  morphine 4 MG/ML injection 4 mg (4 mg Intravenous Given 01/15/16 1700)  ondansetron (ZOFRAN) injection 4 mg (4 mg Intravenous Given 01/15/16 1659)  sodium chloride 0.9 % bolus 1,000 mL (0 mLs Intravenous Stopped 01/15/16 1833)  gi cocktail (Maalox,Lidocaine,Donnatal) (30 mLs Oral Given 01/15/16 1659)     Initial Impression / Assessment and Plan /  ED Course  I have reviewed the triage vital signs and the nursing notes.  Pertinent labs & imaging results that were available during my care of the patient were reviewed by me and considered in my medical decision making (see chart for details).  Clinical Course    55 yo M With a chief complaint of right upper quadrant abdominal pain. Concern for cholecystitis. RUQ Ultrasound.  Ultrasound negative for acute pathology. On reassessment patient is feeling much better. He does have some hepatic steatosis. I think most likely reflux causing this pain. Will start him on Zantac. PCP follow-up.  9:27 PM:  I have discussed the diagnosis/risks/treatment options with the patient and family and believe the pt to be eligible for discharge home to follow-up with PCP. We also discussed returning to the ED  immediately if new or worsening sx occur. We discussed the sx which are most concerning (e.g., sudden worsening pain, fever, inability to tolerate by mouth) that necessitate immediate return. Medications administered to the patient during their visit and any new prescriptions provided to the patient are listed below.  Medications given during this visit Medications  ondansetron (ZOFRAN) injection 4 mg (not administered)  morphine 4 MG/ML injection 4 mg (4 mg Intravenous Given 01/15/16 1700)  ondansetron (ZOFRAN) injection 4 mg (4 mg Intravenous Given 01/15/16 1659)  sodium chloride 0.9 % bolus 1,000 mL (0 mLs Intravenous Stopped 01/15/16 1833)  gi cocktail (Maalox,Lidocaine,Donnatal) (30 mLs Oral Given 01/15/16 1659)     The patient appears reasonably screen and/or stabilized for discharge and I doubt any other medical condition or other [facility] requiring further screening, evaluation, or treatment in the ED at this time prior to discharge.    Final Clinical Impressions(s) / ED Diagnoses   Final diagnoses:  RUQ abdominal pain  Epigastric abdominal pain    New Prescriptions Discharge Medication List as of 01/15/2016  6:03 PM       Melene Plan, DO 01/15/16 2127

## 2016-08-22 ENCOUNTER — Encounter: Payer: Self-pay | Admitting: Cardiovascular Disease

## 2016-08-22 ENCOUNTER — Ambulatory Visit (INDEPENDENT_AMBULATORY_CARE_PROVIDER_SITE_OTHER): Payer: BLUE CROSS/BLUE SHIELD | Admitting: Cardiovascular Disease

## 2016-08-22 VITALS — BP 130/100 | HR 80 | Ht 66.0 in | Wt 192.0 lb

## 2016-08-22 DIAGNOSIS — R072 Precordial pain: Secondary | ICD-10-CM | POA: Diagnosis not present

## 2016-08-22 DIAGNOSIS — I10 Essential (primary) hypertension: Secondary | ICD-10-CM

## 2016-08-22 DIAGNOSIS — I2089 Other forms of angina pectoris: Secondary | ICD-10-CM

## 2016-08-22 DIAGNOSIS — R079 Chest pain, unspecified: Secondary | ICD-10-CM | POA: Insufficient documentation

## 2016-08-22 DIAGNOSIS — I208 Other forms of angina pectoris: Secondary | ICD-10-CM

## 2016-08-22 MED ORDER — AMLODIPINE BESYLATE 5 MG PO TABS: 5.0000 mg | ORAL_TABLET | Freq: Every day | ORAL | 3 refills | Status: DC

## 2016-08-22 NOTE — Progress Notes (Signed)
08/22/2016 [name]   1961-04-30  [id number]  Primary Physician Bradley Lesch, MD Primary Cardiologist: Runell Gess MD [name]  HPI:   [name] is a 56 year old mildly overweight married Latino male father of 67, grandfather and 2 grandchildren who works as a [employer] fundraiser. He was referred by his PCP for evaluation of atypical chest pain. This was such profiles on the notable for treated hypertension. He does not smoke and there is no family history. He did have achalasia surgery here in town 6 years ago. He had an episode of substernal chest pain that was pretty intense a month ago and has had ongoing mild chest pain once or twice a week since that time lasting minutes at a time.   Current Outpatient Prescriptions  Medication Sig Dispense Refill  . Capsaicin (CAPZASIN-HP) 0.1 % CREA Apply 1 application topically daily as needed (for pain).    . cetirizine (ZYRTEC) 10 MG tablet Take 10 mg by mouth daily as needed for allergies.    Marland Kitchen HYDROcodone-acetaminophen (NORCO) 5-325 MG per tablet Take 1 tablet by mouth every 6 (six) hours as needed. 10 tablet 0  . ibuprofen (ADVIL,MOTRIN) 200 MG tablet Take 400 mg by mouth every 6 (six) hours as needed for moderate pain.    Marland Kitchen ibuprofen (ADVIL,MOTRIN) 600 MG tablet Take 1 tablet (600 mg total) by mouth every 6 (six) hours as needed. 30 tablet 0  . lisinopril-hydrochlorothiazide (PRINZIDE,ZESTORETIC) 20-12.5 MG per tablet Take 1 tablet by mouth daily.    . Multiple Vitamin (MULTIVITAMIN WITH MINERALS) TABS tablet Take 1 tablet by mouth daily.    . Omega-3 Fatty Acids (OMEGA 3 PO) Take 1 capsule by mouth daily.    Marland Kitchen omeprazole (PRILOSEC) 20 MG capsule Take 20 mg by mouth daily.    . vitamin C (ASCORBIC ACID) 500 MG tablet Take 500 mg by mouth daily.    Marland Kitchen amLODipine (NORVASC) 5 MG tablet Take 1 tablet (5 mg total) by mouth daily. 180 tablet 3   No current facility-administered medications for this visit.     No  Known Allergies  Social History   Social History  . Marital status: Married    Spouse name: N/A  . Number of children: N/A  . Years of education: N/A   Occupational History  . Not on file.   Social History Main Topics  . Smoking status: Never Smoker  . Smokeless tobacco: Never Used  . Alcohol use No  . Drug use: No  . Sexual activity: Not on file   Other Topics Concern  . Not on file   Social History Narrative  . No narrative on file     Review of Systems: General: negative for chills, fever, night sweats or weight changes.  Cardiovascular: negative for chest pain, dyspnea on exertion, edema, orthopnea, palpitations, paroxysmal nocturnal dyspnea or shortness of breath Dermatological: negative for rash Respiratory: negative for cough or wheezing Urologic: negative for hematuria Abdominal: negative for nausea, vomiting, diarrhea, bright red blood per rectum, melena, or hematemesis Neurologic: negative for visual changes, syncope, or dizziness All other systems reviewed and are otherwise negative except as noted above.    Blood pressure (!) 130/100, pulse 80, height  (1.676 m), weight 192 lb (87.1 kg).  General appearance: alert and no distress Neck: no adenopathy, no carotid bruit, no JVD, supple, symmetrical, trachea midline and thyroid not enlarged, symmetric, no tenderness/mass/nodules Lungs: clear to auscultation bilaterally Heart: regular rate and  rhythm, S1, S2 normal, no murmur, click, rub or gallop Extremities: extremities normal, atraumatic, no cyanosis or edema  EKG sinus rhythm at 80 without ST or T-wave changes. I personally reviewed this EKG  ASSESSMENT AND PLAN:   Essential hypertension History of essential hypertension on lisinopril and hydrochlorothiazide. He said that his blood pressure has been more difficult to control over the last month since his initial episode chest pain. I'm going to add amlodipine 5 mg a day.  Chest pain Mr. [name]  was referred for evaluation of atypical chest pain. His risk factors are only notable for treated hypertension. He has never had a heart attack or stroke. He had a significant episode of chest pain possibly what one month ago and has had ongoing substernal chest pain once or twice a week since that time lasting for minutes at a time associated with shortness of breath and radiation to his right jaw. I am going to get a 2-D echocardiogram and exercise Myoview stress test. His EKG shows no acute changes and his exam is benign.      Runell Gess MD FACP,FACC,FAHA, [facility] 08/22/2016 3:42 PM

## 2016-08-22 NOTE — Assessment & Plan Note (Signed)
History of essential hypertension on lisinopril and hydrochlorothiazide. He said that his blood pressure has been more difficult to control over the last month since his initial episode chest pain. I'm going to add amlodipine 5 mg a day.

## 2016-08-22 NOTE — Patient Instructions (Signed)
Medication Instructions:   START AMLODIPINE 5 MG ONCE DAILY  Testing/Procedures:  Your physician has requested that you have an echocardiogram. Echocardiography is a painless test that uses sound waves to create images of your heart. It provides your doctor with information about the size and shape of your heart and how well your heart's chambers and valves are working. This procedure takes approximately one hour. There are no restrictions for this procedure.   Your physician has requested that you have en exercise stress myoview. For further information please visit [email or web address] Please follow instruction sheet, as given.    Follow-Up:  Your physician recommends that you schedule a follow-up appointment in: AFTER TESTING COMPLETE WITH DR Allyson Sabal

## 2016-08-22 NOTE — Assessment & Plan Note (Signed)
[name] was referred for evaluation of atypical chest pain. His risk factors are only notable for treated hypertension. He has never had a heart attack or stroke. He had a significant episode of chest pain possibly what one month ago and has had ongoing substernal chest pain once or twice a week since that time lasting for minutes at a time associated with shortness of breath and radiation to his right jaw. I am going to get a 2-D echocardiogram and exercise Myoview stress test. His EKG shows no acute changes and his exam is benign.

## 2016-08-24 ENCOUNTER — Telehealth (HOSPITAL_COMMUNITY): Payer: Self-pay

## 2016-08-24 NOTE — Telephone Encounter (Signed)
Encounter complete. 

## 2016-08-29 ENCOUNTER — Ambulatory Visit (HOSPITAL_COMMUNITY)
Admission: RE | Admit: 2016-08-29 | Discharge: 2016-08-29 | Disposition: A | Discharge status: Home / Self Care | Payer: BLUE CROSS/BLUE SHIELD | Source: Ambulatory Visit | Attending: Internal Medicine | Admitting: Internal Medicine

## 2016-08-29 DIAGNOSIS — R072 Precordial pain: Secondary | ICD-10-CM | POA: Diagnosis not present

## 2016-08-29 DIAGNOSIS — R0602 Shortness of breath: Secondary | ICD-10-CM | POA: Diagnosis not present

## 2016-08-29 DIAGNOSIS — I10 Essential (primary) hypertension: Secondary | ICD-10-CM | POA: Insufficient documentation

## 2016-08-29 LAB — MYOCARDIAL PERFUSION IMAGING
CHL CUP MPHR: 165 {beats}/min
CHL CUP NUCLEAR SDS: 0
CHL CUP STRESS STAGE 1 GRADE: 0 %
CHL CUP STRESS STAGE 1 SPEED: 0 mph
CHL CUP STRESS STAGE 10 DBP: 102 mmHg
CHL CUP STRESS STAGE 10 GRADE: 0 %
CHL CUP STRESS STAGE 10 HR: 96 {beats}/min
CHL CUP STRESS STAGE 10 SBP: 131 mmHg
CHL CUP STRESS STAGE 2 GRADE: 0 %
CHL CUP STRESS STAGE 3 GRADE: 0 %
CHL CUP STRESS STAGE 3 SPEED: 1 mph
CHL CUP STRESS STAGE 4 DBP: 88 mmHg
CHL CUP STRESS STAGE 4 GRADE: 10 %
CHL CUP STRESS STAGE 4 HR: 97 {beats}/min
CHL CUP STRESS STAGE 5 SBP: 147 mmHg
CHL CUP STRESS STAGE 6 SBP: 181 mmHg
CHL CUP STRESS STAGE 7 GRADE: 16 %
CHL CUP STRESS STAGE 7 SPEED: 4.3 mph
CHL CUP STRESS STAGE 8 GRADE: 16 %
CHL CUP STRESS STAGE 8 SPEED: 3.2 mph
CHL CUP STRESS STAGE 9 DBP: 89 mmHg
CHL CUP STRESS STAGE 9 HR: 122 {beats}/min
CSEPPMHR: 87 %
Estimated workload: 11.3 METS
Exercise duration (min): 11 min
Exercise duration (sec): 0 s
LV dias vol: 96 mL (ref 62–150)
LVSYSVOL: 53 mL
NUC STRESS TID: 0.97
Peak HR: 144 {beats}/min
Percent HR: 88 %
RPE: 18
Rest HR: 64 {beats}/min
SRS: 0
SSS: 0
Stage 1 HR: 75 {beats}/min
Stage 10 Speed: 0 mph
Stage 2 HR: 75 {beats}/min
Stage 2 Speed: 0.6 mph
Stage 3 HR: 75 {beats}/min
Stage 4 SBP: 141 mmHg
Stage 4 Speed: 1.7 mph
Stage 5 DBP: 90 mmHg
Stage 5 Grade: 12 %
Stage 5 HR: 107 {beats}/min
Stage 5 Speed: 2.5 mph
Stage 6 DBP: 94 mmHg
Stage 6 Grade: 14 %
Stage 6 HR: 127 {beats}/min
Stage 6 Speed: 3.4 mph
Stage 7 HR: 144 {beats}/min
Stage 8 HR: 144 {beats}/min
Stage 9 Grade: 0 %
Stage 9 SBP: 209 mmHg
Stage 9 Speed: 0 mph

## 2016-08-29 MED ORDER — TECHNETIUM TC 99M TETROFOSMIN IV KIT
10.5000 | PACK | Freq: Once | INTRAVENOUS | Status: AC | PRN
  Administered 2016-08-29: 10.5 via INTRAVENOUS
  Filled 2016-08-29: qty 11

## 2016-08-29 MED ORDER — TECHNETIUM TC 99M TETROFOSMIN IV KIT
30.9000 | PACK | Freq: Once | INTRAVENOUS | Status: AC | PRN
  Administered 2016-08-29: 30.9 via INTRAVENOUS
  Filled 2016-08-29: qty 31

## 2016-08-31 ENCOUNTER — Other Ambulatory Visit: Payer: Self-pay | Admitting: Cardiovascular Disease

## 2016-08-31 DIAGNOSIS — I208 Other forms of angina pectoris: Secondary | ICD-10-CM

## 2016-09-04 ENCOUNTER — Ambulatory Visit (HOSPITAL_COMMUNITY): Payer: BLUE CROSS/BLUE SHIELD | Attending: Cardiology

## 2016-09-04 ENCOUNTER — Other Ambulatory Visit: Payer: Self-pay

## 2016-09-04 DIAGNOSIS — R072 Precordial pain: Secondary | ICD-10-CM | POA: Diagnosis not present

## 2016-09-27 ENCOUNTER — Ambulatory Visit (INDEPENDENT_AMBULATORY_CARE_PROVIDER_SITE_OTHER): Payer: BLUE CROSS/BLUE SHIELD | Admitting: Cardiovascular Disease

## 2016-09-27 ENCOUNTER — Encounter: Payer: Self-pay | Admitting: Cardiovascular Disease

## 2016-09-27 DIAGNOSIS — I208 Other forms of angina pectoris: Secondary | ICD-10-CM

## 2016-09-27 NOTE — Patient Instructions (Signed)
Your physician wants you to follow-up in: 6 months with Dr Berry. You will receive a reminder letter in the mail two months in advance. If you don't receive a letter, please call our office to schedule the follow-up appointment.  

## 2016-09-27 NOTE — Assessment & Plan Note (Signed)
[name] returns today for follow-up of his noninvasive test performed in evaluation and atypical chest pain. His 2-D echo was normal as was his Myoview stress test. I've reassured him that his symptoms are probably Not ischemically mediated. 

## 2016-09-27 NOTE — Progress Notes (Signed)
Mr. [name] returns today for follow-up of his noninvasive test performed in evaluation and atypical chest pain. His 2-D echo was normal as was his Myoview stress test. I've reassured him that his symptoms are probably Not ischemically mediated.

## 2016-10-22 ENCOUNTER — Emergency Department (HOSPITAL_BASED_OUTPATIENT_CLINIC_OR_DEPARTMENT_OTHER): Payer: BLUE CROSS/BLUE SHIELD

## 2016-10-22 ENCOUNTER — Emergency Department (HOSPITAL_BASED_OUTPATIENT_CLINIC_OR_DEPARTMENT_OTHER)
Admission: EM | Admit: 2016-10-22 | Discharge: 2016-10-22 | Disposition: A | Discharge status: Home / Self Care | Payer: BLUE CROSS/BLUE SHIELD | Attending: Emergency Medicine | Admitting: Emergency Medicine

## 2016-10-22 ENCOUNTER — Encounter (HOSPITAL_BASED_OUTPATIENT_CLINIC_OR_DEPARTMENT_OTHER): Payer: Self-pay | Admitting: Emergency Medicine

## 2016-10-22 DIAGNOSIS — Z79899 Other long term (current) drug therapy: Secondary | ICD-10-CM | POA: Diagnosis not present

## 2016-10-22 DIAGNOSIS — I10 Essential (primary) hypertension: Secondary | ICD-10-CM | POA: Diagnosis not present

## 2016-10-22 DIAGNOSIS — Z8719 Personal history of other diseases of the digestive system: Secondary | ICD-10-CM | POA: Diagnosis not present

## 2016-10-22 DIAGNOSIS — R066 Hiccough: Secondary | ICD-10-CM

## 2016-10-22 LAB — CBC WITH DIFFERENTIAL/PLATELET
BASOS ABS: 0 10*3/uL (ref 0.0–0.1)
Basophils Relative: 0 %
Eosinophils Absolute: 0.5 10*3/uL (ref 0.0–0.7)
Eosinophils Relative: 6 %
HEMATOCRIT: 43.1 % (ref 39.0–52.0)
HEMOGLOBIN: 15.8 g/dL (ref 13.0–17.0)
LYMPHS PCT: 25 %
Lymphs Abs: 2.2 10*3/uL (ref 0.7–4.0)
MCH: 32.4 pg (ref 26.0–34.0)
MCHC: 36.7 g/dL — ABNORMAL HIGH (ref 30.0–36.0)
MCV: 88.5 fL (ref 78.0–100.0)
MONO ABS: 1.1 10*3/uL — AB (ref 0.1–1.0)
Monocytes Relative: 12 %
NEUTROS ABS: 5.1 10*3/uL (ref 1.7–7.7)
Neutrophils Relative %: 57 %
Platelets: 265 10*3/uL (ref 150–400)
RBC: 4.87 MIL/uL (ref 4.22–5.81)
RDW: 13.1 % (ref 11.5–15.5)
WBC: 8.9 10*3/uL (ref 4.0–10.5)

## 2016-10-22 LAB — COMPREHENSIVE METABOLIC PANEL
ALK PHOS: 70 U/L (ref 38–126)
ALT: 33 U/L (ref 17–63)
AST: 31 U/L (ref 15–41)
Albumin: 4 g/dL (ref 3.5–5.0)
Anion gap: 9 (ref 5–15)
BILIRUBIN TOTAL: 0.5 mg/dL (ref 0.3–1.2)
BUN: 14 mg/dL (ref 6–20)
CALCIUM: 9.4 mg/dL (ref 8.9–10.3)
CO2: 27 mmol/L (ref 22–32)
CREATININE: 0.97 mg/dL (ref 0.61–1.24)
Chloride: 101 mmol/L (ref 101–111)
GFR calc Af Amer: >60 mL/min (ref >60)
Glucose, Bld: 107 mg/dL — ABNORMAL HIGH (ref 65–99)
Potassium: 3.6 mmol/L (ref 3.5–5.1)
Sodium: 137 mmol/L (ref 135–145)
TOTAL PROTEIN: 7.1 g/dL (ref 6.5–8.1)

## 2016-10-22 LAB — LIPASE, BLOOD: Lipase: 45 U/L (ref 11–51)

## 2016-10-22 LAB — TROPONIN I

## 2016-10-22 MED ORDER — CHLORPROMAZINE HCL 25 MG PO TABS: 25.0000 mg | ORAL_TABLET | Freq: Three times a day (TID) | ORAL | 0 refills | Status: DC | PRN

## 2016-10-22 MED ORDER — CHLORPROMAZINE HCL 25 MG PO TABS
25.0000 mg | ORAL_TABLET | Freq: Once | ORAL | Status: AC
  Administered 2016-10-22: 25 mg via ORAL
  Filled 2016-10-22: qty 1

## 2016-10-22 NOTE — Discharge Instructions (Signed)
You have been seen today for persistent hiccups. You may take the thorazine as needed for hiccups over the next 2-3 days. Take 1 tablet (25 mg) three times a day as needed. If this dose does not work, you may take 2 tablets (50 mg) at a time. Please follow up with gastroenterology as soon as possible. Call to make an appointment.

## 2016-10-22 NOTE — ED Provider Notes (Signed)
[facility] Provider Note   CSN: [id number] Arrival date & time: 10/22/16  1628  By signing my name below, I, Linna Darner, attest that this documentation has been prepared under the direction and in the presence of Shawn Joy, PA-C. Electronically Signed: Linna Darner, Scribe. 10/22/2016. 7:35 PM.  History   Chief Complaint Chief Complaint  Patient presents with  . Chest Pain  . Hiccups   The history is provided by the patient. No language interpreter was used.    HPI Comments: [name] is a 56 y.o. male with PMHx of GERD and HTN who presents to the Emergency Department for evaluation of three days of persistent hiccups. He reports associated nausea without vomiting. Patient endorses some pressure-like, 8/10 epigastric pain when he hiccups but denies any abdominal pain without hiccupping. There are no alleviating factors noted. No h/o the same. He has been passing flatulence normally and eating at his baseline. Patient had an esophageal dilation performed seven years ago, reportedly by Dr. Cloretta Ned, but patient is unsure of this physician name. He denies fever/chills, dyspnea, chest pain, dysphagia, odynophagia, drooling, or any other associated symptoms.    Past Medical History:  Diagnosis Date  . GERD (gastroesophageal reflux disease)   . Hypertension     Patient Active Problem List   Diagnosis Date Noted  . Essential hypertension 08/22/2016  . Chest pain 08/22/2016    Past Surgical History:  Procedure Laterality Date  . ESOPHAGEAL DILATION         Home Medications    Prior to Admission medications   Medication Sig Start Date End Date Taking? Authorizing Provider  amLODipine (NORVASC) 5 MG tablet Take 1 tablet (5 mg total) by mouth daily. 08/22/16 11/20/16  Runell Gess, MD  Capsaicin (CAPZASIN-HP) 0.1 % CREA Apply 1 application topically daily as needed (for pain).    [provider]  cetirizine (ZYRTEC) 10 MG tablet Take 10 mg by mouth  daily as needed for allergies.    [provider]  chlorproMAZINE (THORAZINE) 25 MG tablet Take 1 tablet (25 mg total) by mouth 3 (three) times daily as needed for hiccoughs. 10/22/16 10/25/16  Joy, Hillard Danker, PA-C  HYDROcodone-acetaminophen (NORCO) 5-325 MG per tablet Take 1 tablet by mouth every 6 (six) hours as needed. 12/28/14   Kirichenko, Lemont Fillers, PA-C  ibuprofen (ADVIL,MOTRIN) 200 MG tablet Take 400 mg by mouth every 6 (six) hours as needed for moderate pain.    [provider]  ibuprofen (ADVIL,MOTRIN) 600 MG tablet Take 1 tablet (600 mg total) by mouth every 6 (six) hours as needed. 12/28/14   Kirichenko, Lemont Fillers, PA-C  lisinopril-hydrochlorothiazide (PRINZIDE,ZESTORETIC) 20-12.5 MG per tablet Take 1 tablet by mouth daily.    [provider]  Multiple Vitamin (MULTIVITAMIN WITH MINERALS) TABS tablet Take 1 tablet by mouth daily.    [provider]  Omega-3 Fatty Acids (OMEGA 3 PO) Take 1 capsule by mouth daily.    [provider]  omeprazole (PRILOSEC) 20 MG capsule Take 20 mg by mouth daily.    [provider]  vitamin C (ASCORBIC ACID) 500 MG tablet Take 500 mg by mouth daily.    [provider]    Family History Family History  Problem Relation Age of Onset  . Hypertension Mother   . Diabetes Mother   . Hypertension Father     Social History Social History  Substance Use Topics  . Smoking status: Never Smoker  . Smokeless tobacco: Never Used  . Alcohol  use No     Allergies   Patient has no known allergies.   Review of Systems Review of Systems  HENT: Negative for trouble swallowing.   Respiratory: Negative for shortness of breath.   Cardiovascular: Negative for chest pain.  Gastrointestinal: Positive for abdominal pain and nausea. Negative for vomiting.  Musculoskeletal: Negative for back pain.  All other systems reviewed and are negative.  Physical Exam Updated Vital Signs BP 126/88   Pulse 74   Temp  98.6 F (37 C) (Oral)   Resp 11   Wt 190 lb (86.2 kg)   SpO2 98%   BMI 30.67 kg/m   Physical Exam  Constitutional: He appears well-developed and well-nourished. No distress.  HENT:  Head: Normocephalic and atraumatic.  Eyes: Conjunctivae are normal.  Neck: Neck supple.  Cardiovascular: Normal rate, regular rhythm, normal heart sounds and intact distal pulses.   Pulmonary/Chest: Effort normal and breath sounds normal. No respiratory distress.  Abdominal: Soft. There is tenderness (mild) in the epigastric area. There is no guarding.  Musculoskeletal: He exhibits no edema.  Lymphadenopathy:    He has no cervical adenopathy.  Neurological: He is alert.  Skin: Skin is warm and dry. He is not diaphoretic.  Psychiatric: He has a normal mood and affect. His behavior is normal.  Nursing note and vitals reviewed.  ED Treatments / Results  Labs (all labs ordered are listed, but only abnormal results are displayed) Labs Reviewed  CBC WITH DIFFERENTIAL/PLATELET - Abnormal; Notable for the following:       Result Value   MCHC 36.7 (*)    Monocytes Absolute 1.1 (*)    All other components within normal limits  COMPREHENSIVE METABOLIC PANEL - Abnormal; Notable for the following:    Glucose, Bld 107 (*)    All other components within normal limits  TROPONIN I  LIPASE, BLOOD    EKG  EKG Interpretation None       Radiology Dg Chest 2 View  Result Date: 10/22/2016 CLINICAL DATA:  Pt complains of hiccups x 3 days, comes and goes, some acid reflux with the hiccups, chest pain at timesHx HTN, esophageal dilatation EXAM: CHEST  2 VIEW COMPARISON:  01/15/2016 FINDINGS: The heart size and mediastinal contours are within normal limits. Both lungs are clear. No pleural effusion or pneumothorax. The visualized skeletal structures are unremarkable. IMPRESSION: No active cardiopulmonary disease. Electronically Signed   By: Amie Portlandavid  Ormond M.D.   On: 10/22/2016 18:44    Procedures Procedures  (including critical care time)  DIAGNOSTIC STUDIES: Oxygen Saturation is 98% on RA, normal by my interpretation.    COORDINATION OF CARE: 7:33 PM Discussed treatment plan with pt at bedside and pt agreed to plan.  Medications Ordered in ED Medications  chlorproMAZINE (THORAZINE) tablet 25 mg (25 mg Oral Given 10/22/16 1954)     Initial Impression / Assessment and Plan / ED Course  I have reviewed the triage vital signs and the nursing notes.  Pertinent labs & imaging results that were available during my care of the patient were reviewed by me and considered in my medical decision making (see chart for details).  Clinical Course as of Oct 23 1540  Sun Oct 22, 2016  1836 Patient went to x-ray.  [RT]    Clinical Course User Index [RT] Linna Darnerurner, Russell    Patient presents with persistent hiccups. My suspicion for perforation is very low. Patient is nontoxic appearing, afebrile, not tachycardic, not tachypneic, not hypotensive, maintains SPO2 of 99-100% on  room air, and is in no apparent distress. Will try Thorazine and GI follow-up. The patient was given further instructions for home care as well as return precautions. Patient voices understanding of these instructions, accepts the plan, and is comfortable with discharge.  Findings and plan of care discussed with Loren Racer, MD  Vitals:   10/22/16 1845 10/22/16 1900 10/22/16 1915 10/22/16 1930  BP:  (!) 127/93  (!) 118/97  Pulse: 76 71 68 85  Resp: 13 17 10 12   Temp:      TempSrc:      SpO2: 99% 99% 94% 100%  Weight:         Final Clinical Impressions(s) / ED Diagnoses   Final diagnoses:  Intractable hiccups    New Prescriptions Discharge Medication List as of 10/22/2016  7:41 PM    START taking these medications   Details  chlorproMAZINE (THORAZINE) 25 MG tablet Take 1 tablet (25 mg total) by mouth 3 (three) times daily as needed for hiccoughs., Starting Sun 10/22/2016, Until Wed 10/25/2016, Print       I  personally performed the services described in this documentation, which was scribed in my presence. The recorded information has been reviewed and is accurate.   Anselm Pancoast, PA-C 10/23/16 1542    Loren Racer, MD 10/27/16 [phone]

## 2016-10-22 NOTE — ED Triage Notes (Signed)
Chest pain and hiccups x 3 days.

## 2017-04-30 ENCOUNTER — Inpatient Hospital Stay (HOSPITAL_COMMUNITY)
Admission: EM | Admit: 2017-04-30 | Discharge: 2017-05-03 | DRG: 917 | Disposition: A | Discharge status: Other Acute Inpatient Hospital | Payer: BLUE CROSS/BLUE SHIELD | Attending: Internal Medicine | Admitting: Internal Medicine

## 2017-04-30 ENCOUNTER — Encounter (HOSPITAL_COMMUNITY): Payer: Self-pay | Admitting: Emergency Medicine

## 2017-04-30 ENCOUNTER — Other Ambulatory Visit: Payer: Self-pay

## 2017-04-30 DIAGNOSIS — Z79899 Other long term (current) drug therapy: Secondary | ICD-10-CM | POA: Diagnosis not present

## 2017-04-30 DIAGNOSIS — N179 Acute kidney failure, unspecified: Secondary | ICD-10-CM | POA: Diagnosis present

## 2017-04-30 DIAGNOSIS — T1491XA Suicide attempt, initial encounter: Secondary | ICD-10-CM | POA: Diagnosis present

## 2017-04-30 DIAGNOSIS — F332 Major depressive disorder, recurrent severe without psychotic features: Secondary | ICD-10-CM | POA: Diagnosis not present

## 2017-04-30 DIAGNOSIS — T461X2A Poisoning by calcium-channel blockers, intentional self-harm, initial encounter: Secondary | ICD-10-CM | POA: Diagnosis not present

## 2017-04-30 DIAGNOSIS — T465X2A Poisoning by other antihypertensive drugs, intentional self-harm, initial encounter: Secondary | ICD-10-CM | POA: Diagnosis present

## 2017-04-30 DIAGNOSIS — K219 Gastro-esophageal reflux disease without esophagitis: Secondary | ICD-10-CM | POA: Diagnosis present

## 2017-04-30 DIAGNOSIS — I952 Hypotension due to drugs: Secondary | ICD-10-CM | POA: Diagnosis present

## 2017-04-30 DIAGNOSIS — R450 Nervousness: Secondary | ICD-10-CM | POA: Diagnosis not present

## 2017-04-30 DIAGNOSIS — D649 Anemia, unspecified: Secondary | ICD-10-CM | POA: Diagnosis not present

## 2017-04-30 DIAGNOSIS — R059 Cough, unspecified: Secondary | ICD-10-CM

## 2017-04-30 DIAGNOSIS — N189 Chronic kidney disease, unspecified: Secondary | ICD-10-CM | POA: Diagnosis not present

## 2017-04-30 DIAGNOSIS — E876 Hypokalemia: Secondary | ICD-10-CM | POA: Diagnosis present

## 2017-04-30 DIAGNOSIS — T502X5A Adverse effect of carbonic-anhydrase inhibitors, benzothiadiazides and other diuretics, initial encounter: Secondary | ICD-10-CM | POA: Diagnosis present

## 2017-04-30 DIAGNOSIS — T50902A Poisoning by unspecified drugs, medicaments and biological substances, intentional self-harm, initial encounter: Secondary | ICD-10-CM

## 2017-04-30 DIAGNOSIS — I10 Essential (primary) hypertension: Secondary | ICD-10-CM | POA: Diagnosis present

## 2017-04-30 DIAGNOSIS — R05 Cough: Secondary | ICD-10-CM

## 2017-04-30 DIAGNOSIS — F4325 Adjustment disorder with mixed disturbance of emotions and conduct: Secondary | ICD-10-CM | POA: Diagnosis present

## 2017-04-30 DIAGNOSIS — Z6379 Other stressful life events affecting family and household: Secondary | ICD-10-CM | POA: Diagnosis not present

## 2017-04-30 DIAGNOSIS — N170 Acute kidney failure with tubular necrosis: Secondary | ICD-10-CM | POA: Diagnosis present

## 2017-04-30 DIAGNOSIS — T39312A Poisoning by propionic acid derivatives, intentional self-harm, initial encounter: Secondary | ICD-10-CM | POA: Diagnosis not present

## 2017-04-30 DIAGNOSIS — F419 Anxiety disorder, unspecified: Secondary | ICD-10-CM | POA: Diagnosis not present

## 2017-04-30 LAB — RAPID URINE DRUG SCREEN, HOSP PERFORMED
AMPHETAMINES: NOT DETECTED
BARBITURATES: NOT DETECTED
BENZODIAZEPINES: NOT DETECTED
Cocaine: NOT DETECTED
Opiates: NOT DETECTED
TETRAHYDROCANNABINOL: NOT DETECTED

## 2017-04-30 LAB — COMPREHENSIVE METABOLIC PANEL
ALBUMIN: 4.3 g/dL (ref 3.5–5.0)
ALT: 34 U/L (ref 17–63)
AST: 32 U/L (ref 15–41)
Alkaline Phosphatase: 69 U/L (ref 38–126)
Anion gap: 12 (ref 5–15)
BUN: 18 mg/dL (ref 6–20)
CHLORIDE: 100 mmol/L — AB (ref 101–111)
CO2: 23 mmol/L (ref 22–32)
CREATININE: 1.33 mg/dL — AB (ref 0.61–1.24)
Calcium: 9.1 mg/dL (ref 8.9–10.3)
GFR calc Af Amer: >60 mL/min (ref >60)
GFR calc non Af Amer: 59 mL/min — ABNORMAL LOW (ref >60)
GLUCOSE: 99 mg/dL (ref 65–99)
POTASSIUM: 3 mmol/L — AB (ref 3.5–5.1)
Sodium: 135 mmol/L (ref 135–145)
Total Bilirubin: 1.4 mg/dL — ABNORMAL HIGH (ref 0.3–1.2)
Total Protein: 7.4 g/dL (ref 6.5–8.1)

## 2017-04-30 LAB — CBC
HEMATOCRIT: 45.8 % (ref 39.0–52.0)
HEMOGLOBIN: 16.2 g/dL (ref 13.0–17.0)
MCH: 31.8 pg (ref 26.0–34.0)
MCHC: 35.4 g/dL (ref 30.0–36.0)
MCV: 90 fL (ref 78.0–100.0)
Platelets: 302 10*3/uL (ref 150–400)
RBC: 5.09 MIL/uL (ref 4.22–5.81)
RDW: 13.1 % (ref 11.5–15.5)
WBC: 9.9 10*3/uL (ref 4.0–10.5)

## 2017-04-30 LAB — SALICYLATE LEVEL: Salicylate Lvl: <7 mg/dL (ref 2.8–30.0)

## 2017-04-30 LAB — ACETAMINOPHEN LEVEL: Acetaminophen (Tylenol), Serum: <10 ug/mL — ABNORMAL LOW (ref 10–30)

## 2017-04-30 LAB — ETHANOL: Alcohol, Ethyl (B): <10 mg/dL (ref <10)

## 2017-04-30 MED ORDER — SODIUM CHLORIDE 0.9 % IV SOLN: 250.0000 mL | INTRAVENOUS | Status: DC | PRN

## 2017-04-30 MED ORDER — CHLORPROMAZINE HCL 25 MG PO TABS
25.0000 mg | ORAL_TABLET | Freq: Four times a day (QID) | ORAL | Status: DC | PRN
  Filled 2017-04-30: qty 1

## 2017-04-30 MED ORDER — TRAZODONE HCL 50 MG PO TABS: 50.0000 mg | ORAL_TABLET | Freq: Every evening | ORAL | Status: DC | PRN

## 2017-04-30 MED ORDER — ACETAMINOPHEN 650 MG RE SUPP: 650.0000 mg | Freq: Four times a day (QID) | RECTAL | Status: DC | PRN

## 2017-04-30 MED ORDER — ACETAMINOPHEN 325 MG PO TABS
650.0000 mg | ORAL_TABLET | Freq: Four times a day (QID) | ORAL | Status: DC | PRN
  Administered 2017-04-30 – 2017-05-01 (×3): 650 mg via ORAL
  Filled 2017-04-30 (×3): qty 2

## 2017-04-30 MED ORDER — SODIUM CHLORIDE 0.9% FLUSH: 3.0000 mL | INTRAVENOUS | Status: DC | PRN

## 2017-04-30 MED ORDER — POLYETHYLENE GLYCOL 3350 17 G PO PACK: 17.0000 g | PACK | Freq: Every day | ORAL | Status: DC | PRN

## 2017-04-30 MED ORDER — CHLORPROMAZINE HCL 25 MG PO TABS
25.0000 mg | ORAL_TABLET | Freq: Once | ORAL | Status: AC
  Administered 2017-04-30: 25 mg via ORAL
  Filled 2017-04-30: qty 1

## 2017-04-30 MED ORDER — SODIUM CHLORIDE 0.9 % IV BOLUS (SEPSIS)
1000.0000 mL | Freq: Once | INTRAVENOUS | Status: AC
  Administered 2017-04-30: 1000 mL via INTRAVENOUS

## 2017-04-30 MED ORDER — POTASSIUM CHLORIDE CRYS ER 20 MEQ PO TBCR
40.0000 meq | EXTENDED_RELEASE_TABLET | Freq: Once | ORAL | Status: DC
  Filled 2017-04-30: qty 2

## 2017-04-30 MED ORDER — SODIUM CHLORIDE 0.9% FLUSH
3.0000 mL | Freq: Two times a day (BID) | INTRAVENOUS | Status: DC
  Administered 2017-05-01 – 2017-05-03 (×4): 3 mL via INTRAVENOUS

## 2017-04-30 MED ORDER — ONDANSETRON HCL 4 MG/2ML IJ SOLN: 4.0000 mg | Freq: Four times a day (QID) | INTRAMUSCULAR | Status: DC | PRN

## 2017-04-30 MED ORDER — HEPARIN SODIUM (PORCINE) 5000 UNIT/ML IJ SOLN
5000.0000 [IU] | Freq: Three times a day (TID) | INTRAMUSCULAR | Status: DC
  Administered 2017-04-30 – 2017-05-03 (×9): 5000 [IU] via SUBCUTANEOUS
  Filled 2017-04-30 (×9): qty 1

## 2017-04-30 MED ORDER — SODIUM CHLORIDE 0.9 % IV SOLN
Freq: Once | INTRAVENOUS | Status: AC
  Administered 2017-04-30: 15:00:00 via INTRAVENOUS

## 2017-04-30 MED ORDER — SODIUM CHLORIDE 0.9 % IV SOLN
INTRAVENOUS | Status: DC
  Administered 2017-05-01 (×2): via INTRAVENOUS

## 2017-04-30 MED ORDER — SENNA 8.6 MG PO TABS
1.0000 | ORAL_TABLET | Freq: Two times a day (BID) | ORAL | Status: DC
  Administered 2017-05-01 – 2017-05-03 (×4): 8.6 mg via ORAL
  Filled 2017-04-30 (×6): qty 1

## 2017-04-30 MED ORDER — ONDANSETRON HCL 4 MG PO TABS: 4.0000 mg | ORAL_TABLET | Freq: Four times a day (QID) | ORAL | Status: DC | PRN

## 2017-04-30 MED ORDER — POTASSIUM CHLORIDE CRYS ER 20 MEQ PO TBCR
40.0000 meq | EXTENDED_RELEASE_TABLET | Freq: Once | ORAL | Status: AC
Start: 2017-04-30 — End: 2017-04-30
  Administered 2017-04-30: 40 meq via ORAL
  Filled 2017-04-30: qty 2

## 2017-04-30 MED ORDER — ALBUTEROL SULFATE (2.5 MG/3ML) 0.083% IN NEBU: 2.5000 mg | INHALATION_SOLUTION | RESPIRATORY_TRACT | Status: DC | PRN

## 2017-04-30 MED ORDER — VITAMIN C 500 MG PO TABS
500.0000 mg | ORAL_TABLET | Freq: Every day | ORAL | Status: DC
  Administered 2017-05-01 – 2017-05-03 (×3): 500 mg via ORAL
  Filled 2017-04-30 (×3): qty 1

## 2017-04-30 NOTE — ED Provider Notes (Signed)
Medical screening examination/treatment/procedure(s) were conducted as a shared visit with non-physician practitioner(s) and myself.  I personally evaluated the patient during the encounter.  Intentional OD on lisinopril. Initially normotensive but now with intermittent hypotension. IVC'ed. Fluids started poison control consulted.  On my eval, normal mental status. BP still soft. Plan for admission for further monitoring and psych evaluation.     EKG Interpretation  Date/Time:  Monday April 30 2017 07:20:50 EST Ventricular Rate:  89 PR Interval:  150 QRS Duration: 82 QT Interval:  348 QTC Calculation: 423 R Axis:   38 Text Interpretation:  Normal sinus rhythm Normal ECG Confirmed by Marily MemosMesner, Taegen Delker [phone]) on 04/30/2017 2:44:23 PM         Rjay Revolorio, Barbara CowerJason, MD 04/30/17 [id number]

## 2017-04-30 NOTE — ED Notes (Signed)
IVC paperwork faxed and accepted by the Magistrate's office

## 2017-04-30 NOTE — Plan of Care (Signed)
RN answered patient's questions and explained all procedures and precautions to patient with patient verbalizing understanding.  Patient's primary language is Spanish, but patient states he does not need and interpreter and is able to communicate with staff with no issues.  P.J. Henderson NewcomerSexton, RN

## 2017-04-30 NOTE — ED Notes (Signed)
All belongings sent home with brother Kendrick RanchLuis Lineman per pt request. Given clothing, wallet, cell phone, mastercard, and $457 in cash
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
## 2017-04-30 NOTE — ED Notes (Signed)
Nurse will change collect time for HIV to 0500.

## 2017-04-30 NOTE — ED Provider Notes (Signed)
[name] Note   CSN: [id number] Arrival date & time: 04/30/17  [id number]     History   Chief Complaint Chief Complaint  Patient presents with  . Medical Clearance    HPI [name] is a 56 y.o. Frye.  The history is provided by the patient and medical records. No language interpreter was used.   [name] is a 56 y.o. Frye  with a PMH of HTN, GERD who presents to the Emergency Department evaluation following intentional overdose in an attempt at self-harm.  Patient states that he took 5 of his home lisinopril at 2 AM this morning.  He also took 6 over-the-counter ibuprofen.  per chart review, patient has a prescription for lisinopril/HCTZ 20-12.5.  Patient reports that about an hour later he became very dizzy and felt as if he needed to go to the emergency department.  He has been persistently dizzy and lightheaded since this time.  He states that he wanted to kill himself because he had "a lot going on".  He states there is not one particular thing, but a lot in his life that he wanted to get away from.  No chest pain, shortness of breath, abdominal pain, blood in stool, nausea, vomiting, headache or visual changes.  Denies prior suicide attempt.  No HI or auditory/visual hallucinations.  Past Medical History:  Diagnosis Date  . GERD (gastroesophageal reflux disease)   . Hypertension     Patient Active Problem List   Diagnosis Date Noted  . Essential hypertension 08/22/2016  . Chest pain 08/22/2016    Past Surgical History:  Procedure Laterality Date  . ESOPHAGEAL DILATION         Home Medications    Prior to Admission medications   Medication Sig Start Date End Date Taking? Authorizing Provider  amLODipine (NORVASC) 5 MG tablet Take 1 tablet (5 mg total) by mouth daily. 08/22/16 04/30/17 Yes Runell GessBerry, Jonathan J, MD  cetirizine (ZYRTEC) 10 MG tablet Take 10 mg by mouth daily as needed for allergies.   Yes [provider]  ibuprofen (ADVIL,MOTRIN) 600 MG tablet Take 1 tablet (600 mg total) by mouth every 6 (six) hours as needed. Patient taking differently: Take 600 mg by mouth every 6 (six) hours as needed for mild pain.  12/28/14  Yes Kirichenko, Tatyana, PA-C  losartan-hydrochlorothiazide (HYZAAR) 100-12.5 MG tablet Take 1 tablet by mouth daily. 04/18/17  Yes [provider]  Multiple Vitamin (MULTIVITAMIN WITH MINERALS) TABS tablet Take 1 tablet by mouth daily.   Yes [provider]  omeprazole (PRILOSEC) 20 MG capsule Take 20 mg by mouth daily.   Yes [provider]  vitamin C (ASCORBIC ACID) 500 MG tablet Take 500 mg by mouth daily.   Yes [provider]  chlorproMAZINE (THORAZINE) 25 MG tablet Take 1 tablet (25 mg total) by mouth 3 (three) times daily as needed for hiccoughs. 10/22/16 10/25/16  Joy, Hillard DankerShawn C, PA-C  HYDROcodone-acetaminophen (NORCO) 5-325 MG per tablet Take 1 tablet by mouth every 6 (six) hours as needed. Patient not taking: Reported on 04/30/2017 12/28/14   Jaynie CrumbleKirichenko, Tatyana, PA-C    Family History Family History  Problem Relation Age of Onset  . Hypertension Mother   . Diabetes Mother   . Hypertension Father     Social History Social History   Tobacco Use  . Smoking status: Never Smoker  . Smokeless tobacco: Never Used  Substance Use Topics  . Alcohol use: No  .  Drug use: No     Allergies   Patient has no known allergies.   Review of Systems Review of Systems  Neurological: Positive for dizziness and light-headedness.  All other systems reviewed and are negative.    Physical Exam Updated Vital Signs BP (!) 87/64   Pulse 60   Temp 97.8 F (36.6 C) (Oral)   Resp 16   SpO2 95%   Physical Exam  Constitutional: He is oriented to person, place, and time. He appears well-developed and well-nourished. No distress.  HENT:  Head: Normocephalic and atraumatic.  Neck: Neck supple.  Cardiovascular: Normal rate, regular  rhythm and normal heart sounds.  No murmur heard. Pulmonary/Chest: Effort normal and breath sounds normal. No respiratory distress.  Abdominal: Soft. He exhibits no distension. There is no tenderness.  Neurological: He is alert and oriented to person, place, and time.  Skin: Skin is warm and dry.  Nursing note and vitals reviewed.    ED Treatments / Results  Labs (all labs ordered are listed, but only abnormal results are displayed) Labs Reviewed  COMPREHENSIVE METABOLIC PANEL - Abnormal; Notable for the following components:      Result Value   Potassium 3.0 (*)    Chloride 100 (*)    Creatinine, Ser 1.33 (*)    Total Bilirubin 1.4 (*)    GFR calc non Af Amer 59 (*)    All other components within normal limits  ACETAMINOPHEN LEVEL - Abnormal; Notable for the following components:   Acetaminophen (Tylenol), Serum <10 (*)    All other components within normal limits  ETHANOL  SALICYLATE LEVEL  CBC  RAPID URINE DRUG SCREEN, HOSP PERFORMED    EKG  EKG Interpretation  Date/Time:  Monday April 30 2017 07:20:50 EST Ventricular Rate:  89 PR Interval:  150 QRS Duration: 82 QT Interval:  348 QTC Calculation: 423 R Axis:   38 Text Interpretation:  Normal sinus rhythm Normal ECG Confirmed by Marily MemosMesner, Jason [phone]([id number]) on 04/30/2017 2:44:23 PM       Radiology No results found.  Procedures Procedures (including critical care time)  CRITICAL CARE Performed by: Chase PicketJaime Pilcher Ward   Total critical care time: 35 minutes  Critical care time was exclusive of separately billable procedures and treating other patients.  Critical care was necessary to treat or prevent imminent or life-threatening deterioration.  Critical care was time spent personally by me on the following activities: development of treatment plan with patient and/or surrogate as well as nursing, discussions with consultants, evaluation of patient's response to treatment, examination of patient, obtaining  history from patient or surrogate, ordering and performing treatments and interventions, ordering and review of laboratory studies, ordering and review of radiographic studies, pulse oximetry and re-evaluation of patient's condition.   Medications Ordered in ED Medications  potassium chloride SA (K-DUR,KLOR-CON) CR tablet 40 mEq (not administered)  sodium chloride 0.9 % bolus 1,000 mL (0 mLs Intravenous Stopped 04/30/17 1516)  sodium chloride 0.9 % bolus 1,000 mL (0 mLs Intravenous Stopped 04/30/17 1516)  0.9 %  sodium chloride infusion ( Intravenous New Bag/Given 04/30/17 1524)     Initial Impression / Assessment and Plan / ED Course  I have reviewed the triage vital signs and the nursing notes.  Pertinent labs & imaging results that were available during my care of the patient were reviewed by me and considered in my medical decision making (see chart for details).    [name] is a 56 y.o. Frye who presents to ED for  evaluation following intentional overdose of lisinopril and ibuprofen.  Initially blood pressure was 112/80, however while in the emergency department.  BP dropped to 63/45. HR normal in 70's.  Started on IV fluids. After 2L, BP 80's/50's. Started on infusion. Given overdose on long acting medication and persistent hypotension, hospitalist consulted who will admit. IVC paper work filed by attending, Dr. Clayborne Dana. Will need psychiatric evaluation once medically cleared.   Final Clinical Impressions(s) / ED Diagnoses   Final diagnoses:  Hypotension due to drugs  Intentional drug overdose, initial encounter [facility])    ED Discharge Orders    None       Ward, Chase Picket, PA-C 04/30/17 1559    Mesner, Barbara Cower, MD 04/30/17 [phone]

## 2017-04-30 NOTE — ED Notes (Signed)
Pt placed in wine colored scrubs. Pt belongings inventoried and place in locker #1. Valuables given to security. Pt wanded and sitting in hallway.

## 2017-04-30 NOTE — ED Triage Notes (Signed)
Pt presents with dizziness after taking his own Lisinopril x 5 in an attempt to hurt himself following a family dispute last night. Pt denies LOC, denies n/v

## 2017-04-30 NOTE — ED Notes (Signed)
Report attempted x 1

## 2017-04-30 NOTE — ED Notes (Signed)
Family at bedside. 

## 2017-05-01 DIAGNOSIS — T39312A Poisoning by propionic acid derivatives, intentional self-harm, initial encounter: Secondary | ICD-10-CM

## 2017-05-01 DIAGNOSIS — N179 Acute kidney failure, unspecified: Secondary | ICD-10-CM

## 2017-05-01 DIAGNOSIS — I10 Essential (primary) hypertension: Secondary | ICD-10-CM

## 2017-05-01 DIAGNOSIS — F419 Anxiety disorder, unspecified: Secondary | ICD-10-CM

## 2017-05-01 DIAGNOSIS — T50902A Poisoning by unspecified drugs, medicaments and biological substances, intentional self-harm, initial encounter: Secondary | ICD-10-CM

## 2017-05-01 DIAGNOSIS — Z6379 Other stressful life events affecting family and household: Secondary | ICD-10-CM

## 2017-05-01 DIAGNOSIS — R450 Nervousness: Secondary | ICD-10-CM

## 2017-05-01 DIAGNOSIS — T465X2A Poisoning by other antihypertensive drugs, intentional self-harm, initial encounter: Principal | ICD-10-CM

## 2017-05-01 DIAGNOSIS — T461X2A Poisoning by calcium-channel blockers, intentional self-harm, initial encounter: Secondary | ICD-10-CM

## 2017-05-01 DIAGNOSIS — F4325 Adjustment disorder with mixed disturbance of emotions and conduct: Secondary | ICD-10-CM

## 2017-05-01 DIAGNOSIS — I952 Hypotension due to drugs: Secondary | ICD-10-CM

## 2017-05-01 LAB — CBC
HEMATOCRIT: 38.4 % — AB (ref 39.0–52.0)
Hemoglobin: 13.1 g/dL (ref 13.0–17.0)
MCH: 31.1 pg (ref 26.0–34.0)
MCHC: 34.1 g/dL (ref 30.0–36.0)
MCV: 91.2 fL (ref 78.0–100.0)
PLATELETS: 207 10*3/uL (ref 150–400)
RBC: 4.21 MIL/uL — ABNORMAL LOW (ref 4.22–5.81)
RDW: 13.5 % (ref 11.5–15.5)
WBC: 7.9 10*3/uL (ref 4.0–10.5)

## 2017-05-01 LAB — BASIC METABOLIC PANEL
Anion gap: 7 (ref 5–15)
BUN: 24 mg/dL — AB (ref 6–20)
CHLORIDE: 111 mmol/L (ref 101–111)
CO2: 21 mmol/L — AB (ref 22–32)
CREATININE: 1.9 mg/dL — AB (ref 0.61–1.24)
Calcium: 8.1 mg/dL — ABNORMAL LOW (ref 8.9–10.3)
GFR calc Af Amer: 44 mL/min — ABNORMAL LOW (ref >60)
GFR calc non Af Amer: 38 mL/min — ABNORMAL LOW (ref >60)
GLUCOSE: 112 mg/dL — AB (ref 65–99)
Potassium: 3.1 mmol/L — ABNORMAL LOW (ref 3.5–5.1)
SODIUM: 139 mmol/L (ref 135–145)

## 2017-05-01 MED ORDER — POTASSIUM CHLORIDE CRYS ER 20 MEQ PO TBCR
40.0000 meq | EXTENDED_RELEASE_TABLET | Freq: Once | ORAL | Status: AC
  Administered 2017-05-01: 40 meq via ORAL

## 2017-05-01 NOTE — Consult Note (Signed)
[place] [facility] Consult   Reason for Consult:  Suicide attempt by overdose. Referring Physician:  Dr. Lonny Prude Patient Identification: [name] MRN:  [id number] Principal Diagnosis: Adjustment disorder with mixed disturbance of emotions and conduct Diagnosis:   Patient Active Problem List   Diagnosis Date Noted  . Suicide attempt ([place]) [T14.91XA] 04/30/2017  . Hypotension due to medication [I95.2] 04/30/2017  . AKI (acute kidney injury) ([place]) [N17.9] 04/30/2017  . Essential hypertension [I10] 08/22/2016  . Chest pain [R07.9] 08/22/2016    Total Time spent with patient: 1 hour  Subjective:   [name] is a 57 y.o. male patient admitted with suicide attempt by overdose.  HPI:   Per chart review, patient has no prior psychiatric history. He was admitted for an intentional drug overdose with Amlodipine (#6), Losartan/HCTZ (#6) and Ibuprofen (#8) around 2 am on 12/31. He was IVC'd. He required aggressive IV fluid hydration due to persistent hypotension (SBP in the 70s-80s) and potasium replacement. He reportedly was upset with his family (wife, 23 y/o daughter and 74 y/o son) for their spending habits while in [place]. He took all the money out of the bank account. He had an argument with his son and wife over the phone. He felt unappreciated, disrespected and hopeless. He is the only source of income for their household. He brought himself to the ED after he ingested the medication since he was feeling dizzy.   On interview, Mr. [name] reports multiple stressors which led to his overdose. He reports that his relationship with his wife was good until recently after she reconnected with her family. She has been spending time with them and feels like she has been making decisions without considering his opinion. She is currently on a trip in [place]. They had planned to go together but she went with a friend and the children. He reports feeling like it is time to walk away from  his marriage. His stepson messaged him the evening prior to his overdose. He told the patient that he would "fuck him up" if he left his mother and sister. He reports feeling disrespected and reports that his wife did not remedy the situation. He reports frustration over these recent events. He got off work around 7 pm on 12/30. He was crying over the loss of his father due to feeling lonely but also still grieving his loss. He passed away 6 months ago. He reports that he felt "crazy" and impulsively decided to overdose on his antihypertensive medications and Ibuprofen. He denies thoughts to harm self prior to this moment. He went to sleep and woke up the next morning feeling dizzy. His brother called him and realized that he did not sound right so he came to his house. His brother was informed of his overdose when he arrived and also noticed that his gait was impaired. His brother took him to the hospital. He reports "still wanting to cross to be on the other side" today.  He denies time for enjoyable activities because he works 14 hours daily to support his family. He allows his wife to manage their finances. He denies problems with sleep or appetite. He denies HI or AVH. He denies a prior history of anxiety or depression.   Past Psychiatric History: Denies  Risk to Self: Yes. Reports passive SI.  Risk to Others:  None. Denies HI.  Prior Inpatient Therapy:  Denies  Prior Outpatient Therapy:  Denies   Past Medical History:  Past Medical History:  Diagnosis Date  . GERD (gastroesophageal reflux disease)   . Hypertension     Past Surgical History:  Procedure Laterality Date  . ESOPHAGEAL DILATION     Family History:  Family History  Problem Relation Age of Onset  . Hypertension Mother   . Diabetes Mother   . Hypertension Father    Family Psychiatric  History: 3 cousins and an uncle committed suicide.  Social History:  Social History   Substance and Sexual Activity  Alcohol Use No      Social History   Substance and Sexual Activity  Drug Use No    Social History   Socioeconomic History  . Marital status: Married    Spouse name: None  . Number of children: None  . Years of education: None  . Highest education level: None  Social Needs  . Financial resource strain: None  . Food insecurity - worry: None  . Food insecurity - inability: None  . Transportation needs - medical: None  . Transportation needs - non-medical: None  Occupational History  . None  Tobacco Use  . Smoking status: Never Smoker  . Smokeless tobacco: Never Used  Substance and Sexual Activity  . Alcohol use: No  . Drug use: No  . Sexual activity: None  Other Topics Concern  . None  Social History Narrative  . None   Additional Social History: He is from [place]. He has lived in [place] for 10 years. He lives at home with his wife of 89 years, 65 y/o daughter and 66 y/o stepson. He was previously married and has 2 children from this marriage. They divorced 25 years ago after he was incarcerated for 3 years for forging money. He denies committing this crime and reports knowing the person who did it but because he did not identify this person he was charged. He is a Administrator for [employer]. He denies alcohol, tobacco or illicit substance use. He reports prior heavy alcohol use. He reports withdrawal symptoms related to use. He quit on his own. He previously smoked 2 ppd x 8 years.     Allergies:  No Known Allergies  Labs:  Results for orders placed or performed during the hospital encounter of 04/30/17 (from the past 48 hour(s))  Comprehensive metabolic panel     Status: Abnormal   Collection Time: 04/30/17  7:25 AM  Result Value Ref Range   Sodium 135 135 - 145 mmol/L   Potassium 3.0 (L) 3.5 - 5.1 mmol/L   Chloride 100 (L) 101 - 111 mmol/L   CO2 23 22 - 32 mmol/L   Glucose, Bld 99 65 - 99 mg/dL   BUN 18 6 - 20 mg/dL   Creatinine, Ser 1.33 (H) 0.61 - 1.24 mg/dL   Calcium 9.1  8.9 - 10.3 mg/dL   Total Protein 7.4 6.5 - 8.1 g/dL   Albumin 4.3 3.5 - 5.0 g/dL   AST 32 15 - 41 U/L   ALT 34 17 - 63 U/L   Alkaline Phosphatase 69 38 - 126 U/L   Total Bilirubin 1.4 (H) 0.3 - 1.2 mg/dL   GFR calc non Af Amer 59 (L) >60 mL/min   GFR calc Af Amer >60 >60 mL/min    Comment: (NOTE) The eGFR has been calculated using the CKD EPI equation. This calculation has not been validated in all clinical situations. eGFR's persistently <60 mL/min signify possible Chronic Kidney Disease.    Anion gap 12 5 - 15  Ethanol  Status: None   Collection Time: 04/30/17  7:25 AM  Result Value Ref Range   Alcohol, Ethyl (B) <10 <10 mg/dL    Comment:        LOWEST DETECTABLE LIMIT FOR SERUM ALCOHOL IS 10 mg/dL FOR MEDICAL PURPOSES ONLY   Salicylate level     Status: None   Collection Time: 04/30/17  7:25 AM  Result Value Ref Range   Salicylate Lvl <6.8 2.8 - 30.0 mg/dL  Acetaminophen level     Status: Abnormal   Collection Time: 04/30/17  7:25 AM  Result Value Ref Range   Acetaminophen (Tylenol), Serum <10 (L) 10 - 30 ug/mL    Comment:        THERAPEUTIC CONCENTRATIONS VARY SIGNIFICANTLY. A RANGE OF 10-30 ug/mL MAY BE AN EFFECTIVE CONCENTRATION FOR MANY PATIENTS. HOWEVER, SOME ARE BEST TREATED AT CONCENTRATIONS OUTSIDE THIS RANGE. ACETAMINOPHEN CONCENTRATIONS >150 ug/mL AT 4 HOURS AFTER INGESTION AND >50 ug/mL AT 12 HOURS AFTER INGESTION ARE OFTEN ASSOCIATED WITH TOXIC REACTIONS.   cbc     Status: None   Collection Time: 04/30/17  7:25 AM  Result Value Ref Range   WBC 9.9 4.0 - 10.5 K/uL   RBC 5.09 4.22 - 5.81 MIL/uL   Hemoglobin 16.2 13.0 - 17.0 g/dL   HCT 45.8 39.0 - 52.0 %   MCV 90.0 78.0 - 100.0 fL   MCH 31.8 26.0 - 34.0 pg   MCHC 35.4 30.0 - 36.0 g/dL   RDW 13.1 11.5 - 15.5 %   Platelets 302 150 - 400 K/uL  Rapid urine drug screen (hospital performed)     Status: None   Collection Time: 04/30/17  7:40 AM  Result Value Ref Range   Opiates NONE DETECTED NONE  DETECTED   Cocaine NONE DETECTED NONE DETECTED   Benzodiazepines NONE DETECTED NONE DETECTED   Amphetamines NONE DETECTED NONE DETECTED   Tetrahydrocannabinol NONE DETECTED NONE DETECTED   Barbiturates NONE DETECTED NONE DETECTED    Comment: (NOTE) DRUG SCREEN FOR MEDICAL PURPOSES ONLY.  IF CONFIRMATION IS NEEDED FOR ANY PURPOSE, NOTIFY LAB WITHIN 5 DAYS. LOWEST DETECTABLE LIMITS FOR URINE DRUG SCREEN Drug Class                     Cutoff (ng/mL) Amphetamine and metabolites    1000 Barbiturate and metabolites    200 Benzodiazepine                 115 Tricyclics and metabolites     300 Opiates and metabolites        300 Cocaine and metabolites        300 THC                            50   Basic metabolic panel     Status: Abnormal   Collection Time: 05/01/17  5:08 AM  Result Value Ref Range   Sodium 139 135 - 145 mmol/L   Potassium 3.1 (L) 3.5 - 5.1 mmol/L   Chloride 111 101 - 111 mmol/L   CO2 21 (L) 22 - 32 mmol/L   Glucose, Bld 112 (H) 65 - 99 mg/dL   BUN 24 (H) 6 - 20 mg/dL   Creatinine, Ser 1.90 (H) 0.61 - 1.24 mg/dL   Calcium 8.1 (L) 8.9 - 10.3 mg/dL   GFR calc non Af Amer 38 (L) >60 mL/min   GFR calc Af Amer 44 (L) >60 mL/min  Comment: (NOTE) The eGFR has been calculated using the CKD EPI equation. This calculation has not been validated in all clinical situations. eGFR's persistently <60 mL/min signify possible Chronic Kidney Disease.    Anion gap 7 5 - 15  CBC     Status: Abnormal   Collection Time: 05/01/17  5:08 AM  Result Value Ref Range   WBC 7.9 4.0 - 10.5 K/uL   RBC 4.21 (L) 4.22 - 5.81 MIL/uL   Hemoglobin 13.1 13.0 - 17.0 g/dL   HCT 38.4 (L) 39.0 - 52.0 %   MCV 91.2 78.0 - 100.0 fL   MCH 31.1 26.0 - 34.0 pg   MCHC 34.1 30.0 - 36.0 g/dL   RDW 13.5 11.5 - 15.5 %   Platelets 207 150 - 400 K/uL    Current Facility-Administered Medications  Medication Dose Route Frequency Provider Last Rate Last Dose  . 0.9 %  sodium chloride infusion    Intravenous Continuous Denton Brick, Courage, MD 150 mL/hr at 05/01/17 0338    . 0.9 %  sodium chloride infusion  250 mL Intravenous PRN Emokpae, Courage, MD      . acetaminophen (TYLENOL) tablet 650 mg  650 mg Oral Q6H PRN Roxan Hockey, MD   650 mg at 04/30/17 1946   Or  . acetaminophen (TYLENOL) suppository 650 mg  650 mg Rectal Q6H PRN Emokpae, Courage, MD      . albuterol (PROVENTIL) (2.5 MG/3ML) 0.083% nebulizer solution 2.5 mg  2.5 mg Nebulization Q2H PRN Emokpae, Courage, MD      . chlorproMAZINE (THORAZINE) tablet 25 mg  25 mg Oral QID PRN Emokpae, Courage, MD      . heparin injection 5,000 Units  5,000 Units Subcutaneous Q8H Denton Brick, Courage, MD   5,000 Units at 05/01/17 0718  . ondansetron (ZOFRAN) tablet 4 mg  4 mg Oral Q6H PRN Emokpae, Courage, MD       Or  . ondansetron (ZOFRAN) injection 4 mg  4 mg Intravenous Q6H PRN Emokpae, Courage, MD      . polyethylene glycol (MIRALAX / GLYCOLAX) packet 17 g  17 g Oral Daily PRN Emokpae, Courage, MD      . senna (SENOKOT) tablet 8.6 mg  1 tablet Oral BID Denton Brick, Courage, MD   8.6 mg at 05/01/17 0922  . sodium chloride flush (NS) 0.9 % injection 3 mL  3 mL Intravenous Q12H Emokpae, Courage, MD      . sodium chloride flush (NS) 0.9 % injection 3 mL  3 mL Intravenous PRN Emokpae, Courage, MD      . traZODone (DESYREL) tablet 50 mg  50 mg Oral QHS PRN Emokpae, Courage, MD      . vitamin C (ASCORBIC ACID) tablet 500 mg  500 mg Oral Daily Emokpae, Courage, MD   500 mg at 05/01/17 4098    Musculoskeletal: Strength & Muscle Tone: within normal limits Gait & Station: normal Patient leans: N/A  Psychiatric Specialty Exam: Physical Exam  Nursing note and vitals reviewed. Constitutional: He is oriented to person, place, and time. He appears well-developed and well-nourished.  HENT:  Head: Normocephalic and atraumatic.  Neck: Normal range of motion.  Respiratory: Effort normal.  Musculoskeletal: Normal range of motion.  Neurological: He is  alert and oriented to person, place, and time.  Skin: No rash noted.  Psychiatric: He has a normal mood and affect. His speech is normal and behavior is normal. Judgment and thought content normal. Cognition and memory are normal.    Review of  Systems  Constitutional: Negative for chills and fever.  Cardiovascular: Negative for chest pain.  Gastrointestinal: Positive for diarrhea. Negative for abdominal pain, constipation, nausea and vomiting.  Psychiatric/Behavioral: Positive for depression and suicidal ideas. Negative for hallucinations and substance abuse. The patient is nervous/anxious. The patient does not have insomnia.     Blood pressure 94/64, pulse 61, temperature 97.6 F (36.4 C), temperature source Oral, resp. rate 16, weight 85.4 kg (188 lb 4.8 oz), SpO2 94 %.Body mass index is 30.39 kg/m.  General Appearance: Fairly Groomed, middle aged, Hispanic male who is wearing paper hospital scrubs and sitting in a chair with IV placement. NAD.   Eye Contact:  Good  Speech:  Clear and Coherent and Normal Rate  Volume:  Normal  Mood:  "I feel lonely."  Affect:  Appropriate and Full Range  Thought Process:  Goal Directed and Linear  Orientation:  Full (Time, Place, and Person)  Thought Content:  Logical  Suicidal Thoughts:  Yes.  without intent/plan  Homicidal Thoughts:  No  Memory:  Immediate;   Good Recent;   Good Remote;   Good  Judgement:  Poor  Insight:  Fair  Psychomotor Activity:  Normal  Concentration:  Concentration: Good and Attention Span: Good  Recall:  Good  Fund of Knowledge:  Good  Language:  Good  Akathisia:  No  Handed:  Right  AIMS (if indicated):   N/A  Assets:  Communication Skills Desire for Improvement Financial Resources/Insurance Housing Social Support  ADL's:  Intact  Cognition:  WNL  Sleep:   Okay   Assessment:  Mung Rinker is a 57 y.o. male who was admitted with suicide attempt by overdose with antihypertensive medication and Ibuprofen in the  setting of psychosocial stressors. He warrants inpatient psychiatric hospitalization due to the lethality of his suicide attempt requiring aggressive IV hydration due to persistent hypotension. He also continues to have passive SI and is at risk of reattempting.    Treatment Plan Summary: -Patient warrants inpatient psychiatric hospitalization given high risk of harm to self. -Continue bedside sitter.  -Patient declines medications for depression at this time. It is appears that his mood decline is situational in the setting of marital discord.  -Please pursue involuntary commitment if patient refuses voluntary psychiatric hospitalization or attempts to leave the hospital.  -Will sign off on patient at this time. Please consult psychiatry again as needed.     Disposition: Recommend psychiatric Inpatient admission when medically cleared.  Faythe Dingwall, DO 05/01/2017 12:13 PM

## 2017-05-01 NOTE — Progress Notes (Signed)
PROGRESS NOTE    [name]  [id number] DOB: 12/12/1960 DOA: 04/30/2017 PCP: Jearld LeschWilliams, Dwight M, MD   Brief Narrative: Bradley HammedJuan Schoeller is a 57 y.o. male medical history relevant for hypertension, GERD with recurrent hiccups as well as allergic rhinitis. Patient presented after suicide attempt. IVC'd. Psychiatry evaluation pending.   Assessment & Plan:   Principal Problem:   Suicide attempt [facility](HCC) Active Problems:   Essential hypertension   Hypotension due to medication   AKI (acute kidney injury) (HCC)   Suicide attempt Patient states that his attempt was a mistake. IVC. -Psychiatry recommendations  Hypotension Secondary to overdose of amlodipine, losartan, hydrochlorothiazide. BP improving. -Continue to monitor blood pressure  Essential hypertension -Holding antihypertensives secondary to hypotension.  Acute kidney injury Worsened today. Secondary to hypotension. -Repeat BMP in AM   DVT prophylaxis: SCD Code Status: Full code Family Communication: None at bedside Disposition Plan: Pending psych evaluation   Consultants:   Psychiatry  Procedures:   None  Antimicrobials:  None    Subjective: No concerns overnight.   Objective: Vitals:   04/30/17 2115 04/30/17 2150 04/30/17 2309 05/01/17 0610  BP: 111/66 (!) 91/59 (!) 100/56 94/64  Pulse: 76 61  61  Resp: 18 17  16   Temp:  98.2 F (36.8 C)  97.6 F (36.4 C)  TempSrc:  Oral  Oral  SpO2: 94% 95%  94%  Weight:  85.4 kg (188 lb 4.8 oz)      Intake/Output Summary (Last 24 hours) at 05/01/2017 0857 Last data filed at 04/30/2017 2123 Gross per 24 hour  Intake 4000 ml  Output 650 ml  Net 3350 ml   Filed Weights   04/30/17 2150  Weight: 85.4 kg (188 lb 4.8 oz)    Examination:  General exam: Appears calm and comfortable Respiratory system: Clear to auscultation. Respiratory effort normal. Cardiovascular system: S1 & S2 heard, RRR. No murmurs. Gastrointestinal system: Abdomen is  nondistended, soft and nontender. Normal bowel sounds heard. Central nervous system: Alert and oriented. No focal neurological deficits. Extremities: No edema. No calf tenderness Skin: No cyanosis. No rashes Psychiatry: Judgement and insight appear normal. Mood & affect appropriate. No suicidal ideation currently.     Data Reviewed: I have personally reviewed following labs and imaging studies  CBC: Recent Labs  Lab 04/30/17 0725 05/01/17 0508  WBC 9.9 7.9  HGB 16.2 13.1  HCT 45.8 38.4*  MCV 90.0 91.2  PLT 302 207   Basic Metabolic Panel: Recent Labs  Lab 04/30/17 0725 05/01/17 0508  NA 135 139  K 3.0* 3.1*  CL 100* 111  CO2 23 21*  GLUCOSE 99 112*  BUN 18 24*  CREATININE 1.33* 1.90*  CALCIUM 9.1 8.1*   GFR: CrCl cannot be calculated (Unknown ideal weight.). Liver Function Tests: Recent Labs  Lab 04/30/17 0725  AST 32  ALT 34  ALKPHOS 69  BILITOT 1.4*  PROT 7.4  ALBUMIN 4.3   No results for input(s): LIPASE, AMYLASE in the last 168 hours. No results for input(s): AMMONIA in the last 168 hours. Coagulation Profile: No results for input(s): INR, PROTIME in the last 168 hours. Cardiac Enzymes: No results for input(s): CKTOTAL, CKMB, CKMBINDEX, TROPONINI in the last 168 hours. BNP (last 3 results) No results for input(s): PROBNP in the last 8760 hours. HbA1C: No results for input(s): HGBA1C in the last 72 hours. CBG: No results for input(s): GLUCAP in the last 168 hours. Lipid Profile: No results for input(s): CHOL, HDL, LDLCALC, TRIG, CHOLHDL, LDLDIRECT in the  last 72 hours. Thyroid Function Tests: No results for input(s): TSH, T4TOTAL, FREET4, T3FREE, THYROIDAB in the last 72 hours. Anemia Panel: No results for input(s): VITAMINB12, FOLATE, FERRITIN, TIBC, IRON, RETICCTPCT in the last 72 hours. Sepsis Labs: No results for input(s): PROCALCITON, LATICACIDVEN in the last 168 hours.  No results found for this or any previous visit (from the past 240  hour(s)).       Radiology Studies: No results found.      Scheduled Meds: . heparin  5,000 Units Subcutaneous Q8H  . potassium chloride  40 mEq Oral Once  . senna  1 tablet Oral BID  . sodium chloride flush  3 mL Intravenous Q12H  . vitamin C  500 mg Oral Daily   Continuous Infusions: . sodium chloride 150 mL/hr at 05/01/17 0338  . sodium chloride       LOS: 1 day     Jacquelin Hawking, MD [facility] 05/01/2017, 8:57 AM Pager: [phone]  If 7PM-7AM, please contact night-coverage [email or web address] Password [facility] 05/01/2017, 8:57 AM

## 2017-05-01 NOTE — Progress Notes (Signed)
IVC paperwork is in chart. Will continue to monitor.

## 2017-05-02 LAB — BASIC METABOLIC PANEL
Anion gap: 6 (ref 5–15)
BUN: 16 mg/dL (ref 6–20)
CALCIUM: 8.6 mg/dL — AB (ref 8.9–10.3)
CO2: 24 mmol/L (ref 22–32)
Chloride: 109 mmol/L (ref 101–111)
Creatinine, Ser: 1.5 mg/dL — ABNORMAL HIGH (ref 0.61–1.24)
GFR calc non Af Amer: 51 mL/min — ABNORMAL LOW (ref >60)
GFR, EST AFRICAN AMERICAN: 59 mL/min — AB (ref >60)
Glucose, Bld: 100 mg/dL — ABNORMAL HIGH (ref 65–99)
Potassium: 3.1 mmol/L — ABNORMAL LOW (ref 3.5–5.1)
SODIUM: 139 mmol/L (ref 135–145)

## 2017-05-02 LAB — HIV ANTIBODY (ROUTINE TESTING W REFLEX): HIV Screen 4th Generation wRfx: NONREACTIVE

## 2017-05-02 MED ORDER — POTASSIUM CHLORIDE CRYS ER 20 MEQ PO TBCR
40.0000 meq | EXTENDED_RELEASE_TABLET | ORAL | Status: AC
Start: 2017-05-02 — End: 2017-05-02
  Administered 2017-05-02 (×2): 40 meq via ORAL
  Filled 2017-05-02 (×2): qty 2

## 2017-05-02 NOTE — Progress Notes (Signed)
PROGRESS NOTE    [name]  ZOX:[id number] DOB: June 23, 1960 DOA: 04/30/2017 PCP: Jearld Lesch, MD   Brief Narrative: [name] is a 57 y.o. male medical history relevant for hypertension, GERD with recurrent hiccups as well as allergic rhinitis. Patient presented after suicide attempt. IVC'd. Psychiatry recommending inpatient behavioral health admission.   Assessment & Plan:   Principal Problem:   Adjustment disorder with mixed disturbance of emotions and conduct Active Problems:   Essential hypertension   Suicide attempt (HCC)   Hypotension due to medication   AKI (acute kidney injury) (HCC)   Suicide attempt Patient states that his attempt was a mistake. IVC. -Psychiatry recommendations: inpatient behavioral health  Hypotension Secondary to overdose of amlodipine, losartan, hydrochlorothiazide. BP normotensive. -Continue to monitor blood pressure  Essential hypertension -Holding antihypertensives secondary to hypotension.  Acute kidney injury Mild improvement. Likely ATN secondary to hypotension -Repeat BMP in AM  Hypokalemia Likely secondary to diuretic effect of hydrochlorothiazide -Kdur 40 meq x2 doses -recheck in AM   DVT prophylaxis: SCD Code Status: Full code Family Communication: None at bedside Disposition Plan: Inpatient behavioral health   Consultants:   Psychiatry  Procedures:   None  Antimicrobials:  None    Subjective: No chest pain or dyspnea. Feels fine.  Objective: Vitals:   05/01/17 1325 05/01/17 1710 05/01/17 2141 05/02/17 0554  BP: 133/70 (!) 126/96 (!) 138/95 133/83  Pulse: 82 72 62 (!) 55  Resp: 18  18 18   Temp: 98.5 F (36.9 C)  98.1 F (36.7 C) 98.3 F (36.8 C)  TempSrc: Oral  Oral Oral  SpO2: 98%  99% 95%  Weight:        Intake/Output Summary (Last 24 hours) at 05/02/2017 0956 Last data filed at 05/02/2017 0430 Gross per 24 hour  Intake 2500 ml  Output -  Net 2500 ml   Filed Weights   04/30/17  2150  Weight: 85.4 kg (188 lb 4.8 oz)    Examination:  General exam: Appears calm and comfortable Respiratory system: Clear to auscultation. Respiratory effort normal. Cardiovascular system: S1 & S2 heard, RRR. No murmurs. Gastrointestinal system: Abdomen is nondistended, soft and nontender. Normal bowel sounds heard. Central nervous system: Alert and oriented. No focal neurological deficits. Extremities: No edema. No calf tenderness Skin: No cyanosis. No rashes Psychiatry: Judgement and insight appear normal. Mood & affect appropriate. No suicidal ideation currently.     Data Reviewed: I have personally reviewed following labs and imaging studies  CBC: Recent Labs  Lab 04/30/17 0725 05/01/17 0508  WBC 9.9 7.9  HGB 16.2 13.1  HCT 45.8 38.4*  MCV 90.0 91.2  PLT 302 207   Basic Metabolic Panel: Recent Labs  Lab 04/30/17 0725 05/01/17 0508 05/02/17 0427  NA 135 139 139  K 3.0* 3.1* 3.1*  CL 100* 111 109  CO2 23 21* 24  GLUCOSE 99 112* 100*  BUN 18 24* 16  CREATININE 1.33* 1.90* 1.50*  CALCIUM 9.1 8.1* 8.6*   GFR: CrCl cannot be calculated (Unknown ideal weight.). Liver Function Tests: Recent Labs  Lab 04/30/17 0725  AST 32  ALT 34  ALKPHOS 69  BILITOT 1.4*  PROT 7.4  ALBUMIN 4.3   No results for input(s): LIPASE, AMYLASE in the last 168 hours. No results for input(s): AMMONIA in the last 168 hours. Coagulation Profile: No results for input(s): INR, PROTIME in the last 168 hours. Cardiac Enzymes: No results for input(s): CKTOTAL, CKMB, CKMBINDEX, TROPONINI in the last 168 hours. BNP (last 3  results) No results for input(s): PROBNP in the last 8760 hours. HbA1C: No results for input(s): HGBA1C in the last 72 hours. CBG: No results for input(s): GLUCAP in the last 168 hours. Lipid Profile: No results for input(s): CHOL, HDL, LDLCALC, TRIG, CHOLHDL, LDLDIRECT in the last 72 hours. Thyroid Function Tests: No results for input(s): TSH, T4TOTAL, FREET4,  T3FREE, THYROIDAB in the last 72 hours. Anemia Panel: No results for input(s): VITAMINB12, FOLATE, FERRITIN, TIBC, IRON, RETICCTPCT in the last 72 hours. Sepsis Labs: No results for input(s): PROCALCITON, LATICACIDVEN in the last 168 hours.  No results found for this or any previous visit (from the past 240 hour(s)).       Radiology Studies: No results found.      Scheduled Meds: . heparin  5,000 Units Subcutaneous Q8H  . potassium chloride  40 mEq Oral Q4H  . senna  1 tablet Oral BID  . sodium chloride flush  3 mL Intravenous Q12H  . vitamin C  500 mg Oral Daily   Continuous Infusions: . sodium chloride       LOS: 2 days     Jacquelin Hawkingalph Sharley Keeler, MD [facility] 05/02/2017, 9:56 AM Pager: [phone]  If 7PM-7AM, please contact night-coverage [email or web address] Password [email or web address] 05/02/2017, 9:56 AM

## 2017-05-02 NOTE — Progress Notes (Signed)
Patient very open with me.  Very concerned about wife who suddenly acting strange-drinking and going [place] with other family members. Patient concerned about what 57 yr old daughter is seeing her do. Patient seeking hope-hard to see way out of this. Needs encouragement/ Phebe CollaDonna S Resean Brander, Chaplain   05/02/17 1100  Clinical Encounter Type  Visited With Patient;Other (Comment) (sitter present also)  Visit Type Initial;Psychological support;Spiritual support  Referral From Physician  Consult/Referral To Chaplain  Spiritual Encounters  Spiritual Needs Prayer;Emotional  Stress Factors  Patient Stress Factors Major life changes (concerned about daughter and wife who has changed )  Family Stress Factors Family relationships

## 2017-05-03 ENCOUNTER — Inpatient Hospital Stay (HOSPITAL_COMMUNITY)
Admission: AD | Admit: 2017-05-03 | Discharge: 2017-05-07 | DRG: 885 | Disposition: A | Discharge status: Home / Self Care | Payer: BLUE CROSS/BLUE SHIELD | Source: Intra-hospital | Attending: Psychiatry | Admitting: Psychiatry

## 2017-05-03 ENCOUNTER — Other Ambulatory Visit: Payer: Self-pay

## 2017-05-03 ENCOUNTER — Inpatient Hospital Stay (HOSPITAL_COMMUNITY): Payer: BLUE CROSS/BLUE SHIELD

## 2017-05-03 ENCOUNTER — Encounter (HOSPITAL_COMMUNITY): Payer: Self-pay

## 2017-05-03 DIAGNOSIS — Z8249 Family history of ischemic heart disease and other diseases of the circulatory system: Secondary | ICD-10-CM | POA: Diagnosis not present

## 2017-05-03 DIAGNOSIS — N179 Acute kidney failure, unspecified: Secondary | ICD-10-CM | POA: Diagnosis present

## 2017-05-03 DIAGNOSIS — I10 Essential (primary) hypertension: Secondary | ICD-10-CM | POA: Diagnosis present

## 2017-05-03 DIAGNOSIS — N189 Chronic kidney disease, unspecified: Secondary | ICD-10-CM | POA: Diagnosis not present

## 2017-05-03 DIAGNOSIS — Z915 Personal history of self-harm: Secondary | ICD-10-CM

## 2017-05-03 DIAGNOSIS — Z79899 Other long term (current) drug therapy: Secondary | ICD-10-CM | POA: Diagnosis not present

## 2017-05-03 DIAGNOSIS — F4325 Adjustment disorder with mixed disturbance of emotions and conduct: Secondary | ICD-10-CM

## 2017-05-03 DIAGNOSIS — F322 Major depressive disorder, single episode, severe without psychotic features: Principal | ICD-10-CM | POA: Diagnosis present

## 2017-05-03 DIAGNOSIS — D649 Anemia, unspecified: Secondary | ICD-10-CM | POA: Diagnosis present

## 2017-05-03 DIAGNOSIS — K219 Gastro-esophageal reflux disease without esophagitis: Secondary | ICD-10-CM | POA: Diagnosis present

## 2017-05-03 DIAGNOSIS — F332 Major depressive disorder, recurrent severe without psychotic features: Secondary | ICD-10-CM | POA: Diagnosis not present

## 2017-05-03 LAB — BASIC METABOLIC PANEL
Anion gap: 8 (ref 5–15)
BUN: 11 mg/dL (ref 6–20)
CO2: 27 mmol/L (ref 22–32)
CREATININE: 1.38 mg/dL — AB (ref 0.61–1.24)
Calcium: 9.1 mg/dL (ref 8.9–10.3)
Chloride: 102 mmol/L (ref 101–111)
GFR calc Af Amer: >60 mL/min (ref >60)
GFR calc non Af Amer: 56 mL/min — ABNORMAL LOW (ref >60)
GLUCOSE: 122 mg/dL — AB (ref 65–99)
Potassium: 3.3 mmol/L — ABNORMAL LOW (ref 3.5–5.1)
SODIUM: 137 mmol/L (ref 135–145)

## 2017-05-03 MED ORDER — GI COCKTAIL ~~LOC~~
30.0000 mL | Freq: Once | ORAL | Status: AC
  Administered 2017-05-03: 30 mL via ORAL
  Filled 2017-05-03: qty 30

## 2017-05-03 MED ORDER — GUAIFENESIN ER 600 MG PO TB12: 600.0000 mg | ORAL_TABLET | Freq: Two times a day (BID) | ORAL | 0 refills | Status: DC

## 2017-05-03 MED ORDER — HYDROXYZINE HCL 25 MG PO TABS: 25.0000 mg | ORAL_TABLET | Freq: Three times a day (TID) | ORAL | Status: DC | PRN

## 2017-05-03 MED ORDER — POTASSIUM CHLORIDE CRYS ER 20 MEQ PO TBCR
20.0000 meq | EXTENDED_RELEASE_TABLET | Freq: Once | ORAL | Status: AC
  Administered 2017-05-03: 20 meq via ORAL
  Filled 2017-05-03: qty 1

## 2017-05-03 MED ORDER — CALCIUM CARBONATE ANTACID 500 MG PO CHEW
400.0000 mg | CHEWABLE_TABLET | Freq: Once | ORAL | Status: AC
  Administered 2017-05-03: 400 mg via ORAL
  Filled 2017-05-03: qty 2

## 2017-05-03 MED ORDER — HYDROXYZINE HCL 25 MG PO TABS
25.0000 mg | ORAL_TABLET | Freq: Three times a day (TID) | ORAL | Status: DC | PRN
  Administered 2017-05-04 – 2017-05-05 (×2): 25 mg via ORAL
  Filled 2017-05-03 (×2): qty 1

## 2017-05-03 MED ORDER — GUAIFENESIN ER 600 MG PO TB12
600.0000 mg | ORAL_TABLET | Freq: Two times a day (BID) | ORAL | Status: DC
  Administered 2017-05-03: 600 mg via ORAL
  Filled 2017-05-03: qty 1

## 2017-05-03 MED ORDER — HYDRALAZINE HCL 20 MG/ML IJ SOLN
10.0000 mg | Freq: Once | INTRAMUSCULAR | Status: AC
  Administered 2017-05-03: 10 mg via INTRAVENOUS
  Filled 2017-05-03: qty 1

## 2017-05-03 MED ORDER — ACETAMINOPHEN 325 MG PO TABS
650.0000 mg | ORAL_TABLET | Freq: Four times a day (QID) | ORAL | Status: DC | PRN
  Administered 2017-05-03 – 2017-05-05 (×2): 650 mg via ORAL
  Filled 2017-05-03 (×2): qty 2

## 2017-05-03 MED ORDER — AMLODIPINE BESYLATE 5 MG PO TABS
ORAL_TABLET | ORAL | Status: AC
  Administered 2017-05-03: 5 mg via ORAL
  Filled 2017-05-03: qty 1

## 2017-05-03 MED ORDER — ALUM & MAG HYDROXIDE-SIMETH 200-200-20 MG/5ML PO SUSP
30.0000 mL | ORAL | Status: DC | PRN
  Administered 2017-05-03 – 2017-05-04 (×3): 30 mL via ORAL
  Filled 2017-05-03 (×3): qty 30

## 2017-05-03 MED ORDER — AMLODIPINE BESYLATE 5 MG PO TABS
5.0000 mg | ORAL_TABLET | Freq: Every day | ORAL | Status: DC
  Administered 2017-05-03 – 2017-05-07 (×5): 5 mg via ORAL
  Filled 2017-05-03 (×6): qty 1

## 2017-05-03 NOTE — Progress Notes (Signed)
Pt found to have BP of 147/104 via dinamap.  Manual recheck revealed a BP of 143/101.  Paged provider K. Schorr, who ordered a one-time dose of hydralazine 10mg  IV.  Will continue to monitor.

## 2017-05-03 NOTE — Progress Notes (Signed)
Per MD, patient is medically stable for discharge. [facility] is reviewing referral and will call CSW if they are able to accept patient.  Osborne Cascoadia Gurinder Toral LCSWA [phone]

## 2017-05-03 NOTE — Progress Notes (Signed)
Patient is being served IVC and [facility] going [facility]  [facility] Anticipated DC date: 05/03/17 Family notified: Pt's brother present Transport by: [employer]   Per MD [facility]. RN, patient, patient's family, and facility notified of DC. Discharge Summary sent to facility.   RN given number for report [phone] Room 401 bed 2. Accepting MD is [place].  CSW signing off.   Cristobal GoldmannNadia Ryiah Bellissimo, [place] Clinical Social Worker [phone]

## 2017-05-03 NOTE — Progress Notes (Signed)
Adult Psychoeducational Group Note  Date:  05/03/2017 Time:  9:48 PM  Group Topic/Focus:  Wrap-Up Group:   The focus of this group is to help patients review their daily goal of treatment and discuss progress on daily workbooks.  Participation Level:  Active  Participation Quality:  Appropriate  Affect:  Anxious  Cognitive:  Appropriate  Insight: Appropriate  Engagement in Group:  Developing/Improving  Modes of Intervention:  Discussion  Additional Comments:  Patient is concerned about going home to support his family. Patient feels that he made a mistake coming here. Patient fears losing his job, losing housing.   Lyndee HensenGoins, Onofrio Klemp R 05/03/2017, 9:48 PM

## 2017-05-03 NOTE — Tx Team (Signed)
Initial Treatment Plan 05/03/2017 3:40 PM [name] ZOX:[id number]    PATIENT STRESSORS: Financial difficulties Marital or family conflict   PATIENT STRENGTHS: Capable of independent living Wellsite geologistCommunication skills General fund of knowledge Motivation for treatment/growth Physical Health Religious Affiliation   PATIENT IDENTIFIED PROBLEMS: Depression  Suicidal ideation  "I would like to be referred to a family counselor"  "I would like to keep my family together"               DISCHARGE CRITERIA:  Improved stabilization in mood, thinking, and/or behavior Verbal commitment to aftercare and medication compliance  PRELIMINARY DISCHARGE PLAN: Outpatient therapy Medication management  PATIENT/FAMILY INVOLVEMENT: This treatment plan has been presented to and reviewed with the patient, [name].  The patient and family have been given the opportunity to ask questions and make suggestions.  Levin BaconHeather V Avelino Herren, RN 05/03/2017, 3:40 PM

## 2017-05-03 NOTE — Progress Notes (Signed)
Patient is hypertensive. Chart reviewed. Patient ovedrosed on hypertension medications and was hospitalized. Discharge summary by Dr. Sunnie Nielsenegalado recommended continuing amlodipine, but discontinuing lisinopril and HCTZ due to AKI. Resumed amlodipine 5 mg po daily.

## 2017-05-03 NOTE — Progress Notes (Signed)
[facility] able to accept patient today, however, when CSW checked IVC paperwork, there is no paperwork indicating patient was served. CSW re-submitting IVC.   [name] LCSWA [phone]

## 2017-05-03 NOTE — Progress Notes (Signed)
[name] is a 57 year old male being [facility].  [name] was admitted to the medical floor for intentional OD on Amlodipine, Losartan/HCTZ and Ibuprofen.  [name] reported his stressor was his families spending habit while in [place] and being the only sole provider for the family.  [name] felt disrespected and unappreciated.  [name] reported poor relationship with wife, argument with his step son and recent passing of his father as a precursor to his suicide attempt.  [name] has history of GERD and hypertension.  [name] denied HI or A/V hallucinations.  [name] is diagnosed with Major Depressive Disorder.   [facility], [name] currently denies suicidal ideation.  [name] is remorseful about his attempt.   Oriented him to the unit.  Admission paperwork completed and signed.  Belongings searched and secured in locker #  16.  Skin assessment completed and no skin issues noted.  Q 15 minute checks initiated for safety.  We will monitor the progress towards his goals.

## 2017-05-03 NOTE — Progress Notes (Signed)
[facility] and [facility] treatment.

## 2017-05-03 NOTE — Progress Notes (Signed)
[facility]  to give report to the nurse receiving pt, no one picked the call. Will call back later..Marland Kitchen

## 2017-05-03 NOTE — Discharge Summary (Signed)
Physician Discharge Summary  [name] [id number] DOB: 02/16/1961 DOA: 04/30/2017  PCP: Jearld LeschWilliams, Dwight M, MD  Admit date: 04/30/2017 Discharge date: 05/03/2017  Admitted From:  Home  [facility]  Recommendations for Outpatient Follow-up:  1. Follow up with PCP in 1-2 weeks 2. Please obtain BMP/CBC in one week     Discharge Condition: stable.  CODE STATUS: full code.  Diet recommendation: Heart Healthy   Brief/Interim Summary: Bradley HammedJuan Viscomi is a 57 y.o. malemedical history relevant for hypertension, GERD with recurrent hiccups as well as allergic rhinitis. Patient presented after suicide attempt. IVC'd. Psychiatry recommending inpatient behavioral health admission.   Assessment & Plan:   Principal Problem:   Adjustment disorder with mixed disturbance of emotions and conduct Active Problems:   Essential hypertension   Suicide attempt (HCC)   Hypotension due to medication   AKI (acute kidney injury) (HCC)   Suicide attempt Patient states that his attempt was a mistake. IVC. -Psychiatry recommendations: [facility]   Hypotension Secondary to overdose of amlodipine, losartan, hydrochlorothiazide. BP normotensive. -Continue to monitor blood pressure Resolved.    Essential hypertension -BO increasing. Resume norvasc.  Would not resume HCTZ, lisinopril due to AKI  Acute kidney injury Mild improvement. Likely ATN secondary to hypotension -stable. Avoid HCTZ, Lisinopril.   Hypokalemia Likely secondary to diuretic effect of hydrochlorothiazide Replete with 20 meq KCL     Discharge Diagnoses:  Principal Problem:   Adjustment disorder with mixed disturbance of emotions and conduct Active Problems:   Essential hypertension   Suicide attempt (HCC)   Hypotension due to medication   AKI (acute kidney injury) [facility](HCC)    Discharge Instructions  Discharge Instructions    Diet - low sodium heart  healthy   Complete by:  As directed    Increase activity slowly   Complete by:  As directed      Allergies as of 05/03/2017   No Known Allergies     Medication List    STOP taking these medications   HYDROcodone-acetaminophen 5-325 MG tablet Commonly known as:  NORCO   ibuprofen 600 MG tablet Commonly known as:  ADVIL,MOTRIN   losartan-hydrochlorothiazide 100-12.5 MG tablet Commonly known as:  HYZAAR     TAKE these medications   amLODipine 5 MG tablet Commonly known as:  NORVASC Take 1 tablet (5 mg total) by mouth daily.   cetirizine 10 MG tablet Commonly known as:  ZYRTEC Take 10 mg by mouth daily as needed for allergies.   chlorproMAZINE 25 MG tablet Commonly known as:  THORAZINE Take 1 tablet (25 mg total) by mouth 3 (three) times daily as needed for hiccoughs.   guaiFENesin 600 MG 12 hr tablet Commonly known as:  MUCINEX Take 1 tablet (600 mg total) by mouth 2 (two) times daily.   multivitamin with minerals Tabs tablet Take 1 tablet by mouth daily.   omeprazole 20 MG capsule Commonly known as:  PRILOSEC Take 20 mg by mouth daily.   vitamin C 500 MG tablet Commonly known as:  ASCORBIC ACID Take 500 mg by mouth daily.       No Known Allergies  Consultations: Psych   Procedures/Studies: Dg Chest Port 1 View  Result Date: 05/03/2017 CLINICAL DATA:  Chest pain.  Cough. EXAM: PORTABLE CHEST 1 VIEW COMPARISON:  10/22/2016. FINDINGS: Mediastinum hilar structures normal. Lungs are clear. Tiny right pleural effusion cannot be excluded. Thoracic spine scoliosis and degenerative change. IMPRESSION: 1. Tiny right pleural effusion cannot be excluded.  2.  No acute cardiopulmonary disease otherwise noted . Electronically Signed   By: Maisie Fus  Register   On: 05/03/2017 10:27       Subjective: Complaints of mild cough.   Discharge Exam: Vitals:   05/03/17 0426 05/03/17 0546  BP: (!) 143/101 (!) 130/91  Pulse:  69  Resp:  16  Temp:  98.3 F (36.8 C)  SpO2:   97%   Vitals:   05/02/17 2154 05/03/17 0421 05/03/17 0426 05/03/17 0546  BP: (!) 157/88 (!) 147/104 (!) 143/101 (!) 130/91  Pulse: (!) 53 66  69  Resp: 18 16  16   Temp: 97.8 F (36.6 C) 98.2 F (36.8 C)  98.3 F (36.8 C)  TempSrc: Oral Oral  Oral  SpO2: 98% 97%  97%  Weight:        General: Pt is alert, awake, not in acute distress Cardiovascular: RRR, S1/S2 +, no rubs, no gallops Respiratory: CTA bilaterally, no wheezing, no rhonchi Abdominal: Soft, NT, ND, bowel sounds + Extremities: no edema, no cyanosis    The results of significant diagnostics from this hospitalization (including imaging, microbiology, ancillary and laboratory) are listed below for reference.     Microbiology: No results found for this or any previous visit (from the past 240 hour(s)).   Labs: BNP (last 3 results) No results for input(s): BNP in the last 8760 hours. Basic Metabolic Panel: Recent Labs  Lab 04/30/17 0725 05/01/17 0508 05/02/17 0427 05/03/17 0437  NA 135 139 139 137  K 3.0* 3.1* 3.1* 3.3*  CL 100* 111 109 102  CO2 23 21* 24 27  GLUCOSE 99 112* 100* 122*  BUN 18 24* 16 11  CREATININE 1.33* 1.90* 1.50* 1.38*  CALCIUM 9.1 8.1* 8.6* 9.1   Liver Function Tests: Recent Labs  Lab 04/30/17 0725  AST 32  ALT 34  ALKPHOS 69  BILITOT 1.4*  PROT 7.4  ALBUMIN 4.3   No results for input(s): LIPASE, AMYLASE in the last 168 hours. No results for input(s): AMMONIA in the last 168 hours. CBC: Recent Labs  Lab 04/30/17 0725 05/01/17 0508  WBC 9.9 7.9  HGB 16.2 13.1  HCT 45.8 38.4*  MCV 90.0 91.2  PLT 302 207   Cardiac Enzymes: No results for input(s): CKTOTAL, CKMB, CKMBINDEX, TROPONINI in the last 168 hours. BNP: Invalid input(s): POCBNP CBG: No results for input(s): GLUCAP in the last 168 hours. D-Dimer No results for input(s): DDIMER in the last 72 hours. Hgb A1c No results for input(s): HGBA1C in the last 72 hours. Lipid Profile No results for input(s): CHOL,  HDL, LDLCALC, TRIG, CHOLHDL, LDLDIRECT in the last 72 hours. Thyroid function studies No results for input(s): TSH, T4TOTAL, T3FREE, THYROIDAB in the last 72 hours.  Invalid input(s): FREET3 Anemia work up No results for input(s): VITAMINB12, FOLATE, FERRITIN, TIBC, IRON, RETICCTPCT in the last 72 hours. Urinalysis    Component Value Date/Time   COLORURINE YELLOW 01/15/2016 1427   APPEARANCEUR CLEAR 01/15/2016 1427   LABSPEC 1.011 01/15/2016 1427   PHURINE 7.0 01/15/2016 1427   GLUCOSEU NEGATIVE 01/15/2016 1427   HGBUR NEGATIVE 01/15/2016 1427   BILIRUBINUR NEGATIVE 01/15/2016 1427   KETONESUR NEGATIVE 01/15/2016 1427   PROTEINUR NEGATIVE 01/15/2016 1427   UROBILINOGEN 0.2 12/28/2014 1142   NITRITE NEGATIVE 01/15/2016 1427   LEUKOCYTESUR NEGATIVE 01/15/2016 1427   Sepsis Labs Invalid input(s): PROCALCITONIN,  WBC,  LACTICIDVEN Microbiology No results found for this or any previous visit (from the past 240 hour(s)).   Time coordinating  discharge: Over 30 minutes  SIGNED:   Alba Cory, MD  [facility] 05/03/2017, 11:55 AM Pager   If 7PM-7AM, please contact night-coverage [email or web address] Password [email or web address]

## 2017-05-03 NOTE — Progress Notes (Signed)
Patient ID: [name], male   DOB: 03/26/1961, 57 y.o.   MRN: [id number]  Pt currently presents with a flat affect and anxious behavior. Pt reports to writer that their goal is to "figure out if I will be back on my high blood pressure medications tomorrow." Pt states "I have a headache, I think from my bp being high." Pt is hypertensive, see Flowsheets. Pt reports good sleep without taking any current medications. Rates headache pain 8/10. Also reports ongoing heartburn.   Pt provided with medications per providers orders. Pt's labs and vitals were monitored throughout the night. Pt given a 1:1 about emotional and mental status. Pt supported and encouraged to express concerns and questions. Pt educated on medications and positioning alleviate symptoms of high blood pressure. Provider notified of trend in bp, see MAR. Per [facility] MD, does not want patient to be restarted on Lisinopril or HCTZ due to risk of kidney damage.   Pt's safety ensured with 15 minute and environmental checks. Pt currently denies SI/HI and A/V hallucinations. Pt verbally agrees to seek staff if SI/HI or A/VH occurs and to consult with staff before acting on any harmful thoughts. Pt reports headache is non existent after medication administration, seen talking and smiling on the phone for most of the night. Will continue POC.

## 2017-05-04 DIAGNOSIS — D649 Anemia, unspecified: Secondary | ICD-10-CM

## 2017-05-04 DIAGNOSIS — N189 Chronic kidney disease, unspecified: Secondary | ICD-10-CM

## 2017-05-04 DIAGNOSIS — F332 Major depressive disorder, recurrent severe without psychotic features: Secondary | ICD-10-CM

## 2017-05-04 DIAGNOSIS — K219 Gastro-esophageal reflux disease without esophagitis: Secondary | ICD-10-CM

## 2017-05-04 DIAGNOSIS — I10 Essential (primary) hypertension: Secondary | ICD-10-CM

## 2017-05-04 DIAGNOSIS — F4325 Adjustment disorder with mixed disturbance of emotions and conduct: Secondary | ICD-10-CM

## 2017-05-04 MED ORDER — SERTRALINE HCL 25 MG PO TABS
25.0000 mg | ORAL_TABLET | Freq: Every day | ORAL | Status: DC
  Administered 2017-05-04 – 2017-05-06 (×3): 25 mg via ORAL
  Filled 2017-05-04 (×5): qty 1

## 2017-05-04 MED ORDER — PANTOPRAZOLE SODIUM 20 MG PO TBEC
20.0000 mg | DELAYED_RELEASE_TABLET | Freq: Every day | ORAL | Status: DC
  Administered 2017-05-04 – 2017-05-07 (×4): 20 mg via ORAL
  Filled 2017-05-04 (×6): qty 1

## 2017-05-04 NOTE — Progress Notes (Signed)
Adult Psychoeducational Group Note  Date:  05/04/2017 Time:  9:11 PM  Group Topic/Focus:  Wrap-Up Group:   The focus of this group is to help patients review their daily goal of treatment and discuss progress on daily workbooks.  Participation Level:  Active  Participation Quality:  Appropriate  Affect:  Appropriate  Cognitive:  Appropriate  Insight: Appropriate  Engagement in Group:  Engaged  Modes of Intervention:  Discussion  Additional Comments:  Patient attended group and said that his day was a 10. His coping skills for today were playing basketball, socializing, and watching tv.  Willeen Novak W Deneka Greenwalt 05/04/2017, 9:11 PM

## 2017-05-04 NOTE — Tx Team (Signed)
Interdisciplinary Treatment and Diagnostic Plan Update  05/04/2017 Time of Session: [name] MRN: [id number]  Principal Diagnosis: MDD (major depressive disorder), severe ([place])  Secondary Diagnoses: Principal Problem:   MDD (major depressive disorder), severe ([place]) Active Problems:   Adjustment disorder with mixed disturbance of emotions and conduct   Current Medications:  Current Facility-Administered Medications  Medication Dose Route Frequency Provider Last Rate Last Dose  . acetaminophen (TYLENOL) tablet 650 mg  650 mg Oral Q6H PRN Rankin, [name], NP   650 mg at 05/03/17 2109  . alum & mag hydroxide-simeth (MAALOX/MYLANTA) 200-200-20 MG/5ML suspension 30 mL  30 mL Oral Q4H PRN Rankin, [name], NP   30 mL at 05/04/17 0335  . amLODipine (NORVASC) tablet 5 mg  5 mg Oral Daily [name], NP   5 mg at 05/04/17 0848  . hydrOXYzine (ATARAX/VISTARIL) tablet 25 mg  25 mg Oral TID PRN Rankin, [name], NP      . sertraline (ZOLOFT) tablet 25 mg  25 mg Oral Daily [name], [name], MD       PTA Medications: Medications Prior to Admission  Medication Sig Dispense Refill Last Dose  . amLODipine (NORVASC) 5 MG tablet Take 1 tablet (5 mg total) by mouth daily. 180 tablet 3 04/29/2017 at Unknown time  . cetirizine (ZYRTEC) 10 MG tablet Take 10 mg by mouth daily as needed for allergies.   unk at [employer]  . chlorproMAZINE (THORAZINE) 25 MG tablet Take 1 tablet (25 mg total) by mouth 3 (three) times daily as needed for hiccoughs. 9 tablet 0   . guaiFENesin (MUCINEX) 600 MG 12 hr tablet Take 1 tablet (600 mg total) by mouth 2 (two) times daily. 20 tablet 0   . Multiple Vitamin (MULTIVITAMIN WITH MINERALS) TABS tablet Take 1 tablet by mouth daily.   04/29/2017 at Unknown time  . omeprazole (PRILOSEC) 20 MG capsule Take 20 mg by mouth daily.   04/29/2017 at Unknown time  . vitamin C (ASCORBIC ACID) 500 MG tablet Take 500 mg by mouth daily.   04/29/2017 at Unknown time    Patient  Stressors: Financial difficulties Marital or family conflict  Patient Strengths: Capable of independent living Curator fund of knowledge Motivation for treatment/growth Physical Health Religious Affiliation  Treatment Modalities: Medication Management, Group therapy, Case management,  1 to 1 session with clinician, Psychoeducation, Recreational therapy.   Physician Treatment Plan for Primary Diagnosis: MDD (major depressive disorder), severe ([place]) Long Term Goal(s): Improvement in symptoms so as ready for discharge Improvement in symptoms so as ready for discharge   Short Term Goals: Ability to identify changes in lifestyle to reduce recurrence of condition will improve Ability to verbalize feelings will improve Ability to disclose and discuss suicidal ideas Ability to demonstrate self-control will improve Ability to identify and develop effective coping behaviors will improve Ability to maintain clinical measurements within normal limits will improve Compliance with prescribed medications will improve Ability to identify changes in lifestyle to reduce recurrence of condition will improve Ability to verbalize feelings will improve Ability to disclose and discuss suicidal ideas Ability to demonstrate self-control will improve Ability to identify and develop effective coping behaviors will improve Ability to maintain clinical measurements within normal limits will improve Compliance with prescribed medications will improve  Medication Management: Evaluate patient's response, side effects, and tolerance of medication regimen.  Therapeutic Interventions: 1 to 1 sessions, Unit Group sessions and Medication administration.  Evaluation of Outcomes: Not Met  Physician Treatment Plan  for Secondary Diagnosis: Principal Problem:   MDD (major depressive disorder), severe ([place]) Active Problems:   Adjustment disorder with mixed disturbance of emotions and  conduct  Long Term Goal(s): Improvement in symptoms so as ready for discharge Improvement in symptoms so as ready for discharge   Short Term Goals: Ability to identify changes in lifestyle to reduce recurrence of condition will improve Ability to verbalize feelings will improve Ability to disclose and discuss suicidal ideas Ability to demonstrate self-control will improve Ability to identify and develop effective coping behaviors will improve Ability to maintain clinical measurements within normal limits will improve Compliance with prescribed medications will improve Ability to identify changes in lifestyle to reduce recurrence of condition will improve Ability to verbalize feelings will improve Ability to disclose and discuss suicidal ideas Ability to demonstrate self-control will improve Ability to identify and develop effective coping behaviors will improve Ability to maintain clinical measurements within normal limits will improve Compliance with prescribed medications will improve     Medication Management: Evaluate patient's response, side effects, and tolerance of medication regimen.  Therapeutic Interventions: 1 to 1 sessions, Unit Group sessions and Medication administration.  Evaluation of Outcomes: Not Met   RN Treatment Plan for Primary Diagnosis: MDD (major depressive disorder), severe ([place]) Long Term Goal(s): Knowledge of disease and therapeutic regimen to maintain health will improve  Short Term Goals: Ability to identify and develop effective coping behaviors will improve and Compliance with prescribed medications will improve  Medication Management: RN will administer medications as ordered by provider, will assess and evaluate patient's response and provide education to patient for prescribed medication. RN will report any adverse and/or side effects to prescribing provider.  Therapeutic Interventions: 1 on 1 counseling sessions, Psychoeducation, Medication  administration, Evaluate responses to treatment, Monitor vital signs and CBGs as ordered, Perform/monitor CIWA, COWS, AIMS and Fall Risk screenings as ordered, Perform wound care treatments as ordered.  Evaluation of Outcomes: Not Met   LCSW Treatment Plan for Primary Diagnosis: MDD (major depressive disorder), severe ([place]) Long Term Goal(s): Safe transition to appropriate next level of care at discharge, Engage patient in therapeutic group addressing interpersonal concerns.  Short Term Goals: Engage patient in aftercare planning with referrals and resources, Increase social support and Increase skills for wellness and recovery  Therapeutic Interventions: Assess for all discharge needs, 1 to 1 time with Social worker, Explore available resources and support systems, Assess for adequacy in community support network, Educate family and significant other(s) on suicide prevention, Complete Psychosocial Assessment, Interpersonal group therapy.  Evaluation of Outcomes: Not Met   Progress in Treatment: Attending groups: Yes. Participating in groups: Yes. Taking medication as prescribed: Yes. Toleration medication: Yes. Family/Significant other contact made: No, will contact:  when given permission Patient understands diagnosis: Yes. Discussing patient identified problems/goals with staff: Yes. Medical problems stabilized or resolved: Yes. Denies suicidal/homicidal ideation: Yes. Issues/concerns per patient self-inventory: No. Other: none  New problem(s) identified: No, Describe:  none  New Short Term/Long Term Goal(s):  Discharge Plan or Barriers:   Reason for Continuation of Hospitalization: Depression Medication stabilization  Estimated Length of Stay:3-5 days.  Attendees: Patient: 05/04/2017   Physician: Dr Sanjuana Letters, MD 05/04/2017   Nursing: Desma Paganini, RN 05/04/2017   RN Care Manager: 05/04/2017   Social Worker: Lurline Idol, LCSW 05/04/2017   Recreational Therapist:  05/04/2017    Other:  05/04/2017   Other:  05/04/2017   Other: 05/04/2017        Scribe for Treatment Team: Joanne Chars,  LCSW 05/04/2017 2:30 PM

## 2017-05-04 NOTE — Progress Notes (Signed)
Recreation Therapy Notes  Date: 05/04/17 Time: 0930 Location: [address] Dayroom  Group Topic: Stress Management  Goal Area(s) Addresses:  Patient will verbalize importance of using healthy stress management.  Patient will identify positive emotions associated with healthy stress management.   Intervention: Stress Management  Activity :  Meditation.  LRT introduced the stress management technique of meditation to patients.  Patients were to listen as the meditation guided them on forgiveness of self.  Education:  Stress Management, Discharge Planning.   Education Outcome: Acknowledges edcuation/In group clarification offered/Needs additional education  Clinical Observations/Feedback: Pt did not attend group.    Caroll RancherMarjette Del Overfelt, LRT/CTRS         Caroll RancherLindsay, Fremon Zacharia A 05/04/2017 12:10 PM

## 2017-05-04 NOTE — Progress Notes (Signed)
Nursing Note: 0700-1900  D:  Pt presents with anxious mood and anxious/pleasant affect.  States " I have many problems at home but my biggest problem is losing my father, he was my best friend.  I felt blind when I took those medications, I didn't know what I was doing.  I know what is important to me, family.  I do not want to hurt myself and I won't do it again."  Pt c/o stomach pain and takes Prilosec at home for GERD. Rates depression as 8/10 today.  A:  Pt started on Zoloft and Protonix today, teaching provided prior to administration.  Encouraged to verbalize needs and concerns, active listening and support provided.  Continued Q 15 minute safety checks.    R:  Pt. pleasant and cooperative, interacts minimally with peers in milieu-spent much time on telephone with family members today.  Denies A/V hallucinations and is able to verbally contract for safety.

## 2017-05-04 NOTE — [facility] Suicide Risk Assessment (Signed)
[facility] Admission Suicide Risk Assessment   Nursing information obtained from:  Patient Demographic factors:  Male Current Mental Status:  Self-harm behaviors Loss Factors:  Financial problems / change in socioeconomic status Historical Factors:  NA Risk Reduction Factors:  Employed, Living with another person, especially a relative  Total Time spent with patient: 30 minutes Principal Problem: <principal problem not specified> Diagnosis:   Patient Active Problem List   Diagnosis Date Noted  . MDD (major depressive disorder), severe ([facility]) [F32.2] 05/03/2017  . Adjustment disorder with mixed disturbance of emotions and conduct [F43.25] 05/01/2017  . Suicide attempt ([facility]) [T14.91XA] 04/30/2017  . Hypotension due to medication [I95.2] 04/30/2017  . AKI (acute kidney injury) ([facility]) [N17.9] 04/30/2017  . Essential hypertension [I10] 08/22/2016  . Chest pain [R07.9] 08/22/2016   Subjective Data:  57 y.o Hispanic male, married, lives with his family, works as a [employer] architect. No past history of mental illness. Presented to the ER to the ER in company of his brother. Overdosed on his antihypertensive medications. Main stressors is difficulties at home with his family. No alcohol or substance use. Routine labs significant for low potassium, anemia and moderately low GFR. No past suicidal behavior, no evidence of psychosis. No evidence of mania. No cognitive impairment. No access to weapons. He is cooperative with care. He has agreed to treatment recommendations. He has agreed to communicate suicidal thoughts of with staff if the thoughts becomes overwhelming.       Continued Clinical Symptoms:  Alcohol Use Disorder Identification Test Final Score (AUDIT): 0 The "Alcohol Use Disorders Identification Test", Guidelines for Use in Primary Care, Second Edition.  World Science writer [facility]). Score between 0-7:  no or low risk or alcohol related problems. Score between 8-15:  moderate risk of alcohol  related problems. Score between 16-19:  high risk of alcohol related problems. Score 20 or above:  warrants further diagnostic evaluation for alcohol dependence and treatment.   CLINICAL FACTORS:   Depression:   Impulsivity   Musculoskeletal: Strength & Muscle Tone: within normal limits Gait & Station: normal Patient leans: N/A  Psychiatric Specialty Exam: Physical Exam  ROS  Blood pressure (!) 143/99, pulse 72, temperature 98.2 F (36.8 C), temperature source Oral, resp. rate 16, height 5' 4.25" (1.632 m), weight 84.8 kg (187 lb).Body mass index is 31.85 kg/m.  General Appearance:   Eye Contact:  As in H&P  Speech:    Volume:    Mood:    Affect:    Thought Process:    Orientation:    Thought Content:    Suicidal Thoughts:    Homicidal Thoughts:    Memory:    Judgement:  As in H&P  Insight:    Psychomotor Activity:    Concentration:    Recall:    Fund of Knowledge:    Language:    Akathisia:    Handed:    AIMS (if indicated):     Assets:  As in H&P  ADL's:    Cognition:    Sleep:  Number of Hours: 6      COGNITIVE FEATURES THAT CONTRIBUTE TO RISK:  None    SUICIDE RISK:   Minimal: No identifiable suicidal ideation.  Patients presenting with no risk factors but with morbid ruminations; may be classified as minimal risk based on the severity of the depressive symptoms  PLAN OF CARE:  As in H&P  I certify that inpatient services furnished can reasonably be expected to improve the patient's condition.   Oswaldo Done  Geraldo PitterA [name], MD 05/04/2017, 2:09 PM
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
## 2017-05-04 NOTE — [facility] Group Notes (Signed)
  [facility] LCSW Group Therapy Note  Date/Time: 05/04/17, 1315  Type of Therapy/Topic:  Group Therapy:  Emotion Regulation  Participation Level:  Minimal   Mood:pleasant  Description of Group:    The purpose of this group is to assist patients in learning to regulate negative emotions and experience positive emotions. Patients will be guided to discuss ways in which they have been vulnerable to their negative emotions. These vulnerabilities will be juxtaposed with experiences of positive emotions or situations, and patients challenged to use positive emotions to combat negative ones. Special emphasis will be placed on coping with negative emotions in conflict situations, and patients will process healthy conflict resolution skills.  Therapeutic Goals: 1. Patient will identify two positive emotions or experiences to reflect on in order to balance out negative emotions:  2. Patient will label two or more emotions that they find the most difficult to experience:  3. Patient will be able to demonstrate positive conflict resolution skills through discussion or role plays:   Summary of Patient Progress:Pt came late to group, near the end, but did manage to make several comments during group discussion.       Therapeutic Modalities:   Cognitive Behavioral Therapy Feelings Identification Dialectical Behavioral Therapy  Daleen SquibbGreg Shamecka Hocutt, LCSW
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
## 2017-05-04 NOTE — [facility] Suicide Risk Assessment (Signed)
[facility] INPATIENT:  Family/Significant Other Suicide Prevention Education  Suicide Prevention Education:   Patient Refusal for Family/Significant Other Suicide Prevention Education: The patient [name] has refused to provide written consent for family/significant other to be provided Family/Significant Other Suicide Prevention Education during admission and/or prior to discharge.  Physician notified.   Baldo DaubRodney B [facility] 05/04/2017, 10:12 AM

## 2017-05-05 NOTE — Progress Notes (Signed)
D: Pt was in the hallway on the phone upon initial approach.  Pt presents with anxious affect and mood.  Describes his day as "very good, we went outside, had good meals."  His goal is "keeping more faith."  He is respectful towards staff and smiles on occasion while interacting with others.  Pt denies SI/HI, denies hallucinations, denies pain.  He did not attend evening group tonight.   A: Introduced self to pt.  Actively listened to pt and offered support and encouragement. PRN medication administered for anxiety.  Q15 minute safety checks maintained.  R: Pt is safe on the unit.  Pt is compliant with medication.  Pt verbally contracts for safety.  Will continue to monitor and assess.

## 2017-05-05 NOTE — Progress Notes (Signed)
D   Pt is pleasant on approach and cooperative   He complained of acid reflux and requested some mylanta and ginger ale   He endorses depression and some anxiety and requested something for the anxiety  A   Verbal support and encouragement given   Medications administered and effectiveness monitored   Q 15 min checks R   Pt is safe at present

## 2017-05-05 NOTE — [facility] Group Notes (Signed)
[facility] Group Notes: (Clinical Social Work)   05/05/2017      Type of Therapy:  Group Therapy   Participation Level:  Did Not Attend despite MHT prompting   Ambrose MantleMareida Grossman-Orr, LCSW 05/05/2017, 11:51 AM

## 2017-05-05 NOTE — Plan of Care (Signed)
  Progressing Safety: Periods of time without injury will increase 05/05/2017 2353 - Progressing by Arrie Aranhurch, Gaylin Osoria J, RN Note Pt has not harmed self or others tonight.  He denies SI/HI and verbally contracts for safety.

## 2017-05-05 NOTE — Progress Notes (Signed)
[facility] MD Progress Note  05/05/2017 3:17 PM [name]  MRN:  [id number] Subjective:  I had a very good day. I really want to return back to group. That group is very informative and educational.   Objective: [name] is a 57 year old male who presented with an OD on his antihypertensive medications. On evaluation the patient reported: Patient states that he feels great.  States that he is eating/sleeping without difficulty; tolerating medications without adverse reactions.  Reports that he continues to attend/participate in group which is helping him learn to communicate better.  States that he is working with his wife to address his concerns. " I have accepted things over my life and to control what I can control." He notes slight improvement since his admission to the unit as he has been active in group.  At this time patient denies suicidal/self harming thoughts an psychosis.   Principal Problem: MDD (major depressive disorder), severe (HCC) Diagnosis:   Patient Active Problem List   Diagnosis Date Noted  . MDD (major depressive disorder), severe (HCC) [F32.2] 05/03/2017  . Adjustment disorder with mixed disturbance of emotions and conduct [F43.25] 05/01/2017  . Suicide attempt (HCC) [T14.91XA] 04/30/2017  . Hypotension due to medication [I95.2] 04/30/2017  . AKI (acute kidney injury) (HCC) [N17.9] 04/30/2017  . Essential hypertension [I10] 08/22/2016  . Chest pain [R07.9] 08/22/2016   Total Time spent with patient: 30 minutes  Past Psychiatric History:No past history of mental health treatment. No past history of mania. No past history of psychosis. No past history of suicidal behavior. No past history of violent behavior.     Past Medical History:  Past Medical History:  Diagnosis Date  . GERD (gastroesophageal reflux disease)   . Hypertension     Past Surgical History:  Procedure Laterality Date  . ESOPHAGEAL DILATION     Family History:  Family History  Problem Relation Age of  Onset  . Hypertension Mother   . Diabetes Mother   . Hypertension Father    Family Psychiatric  History: Has uncles in [place] that committed suicide. No formal diagnosis of mental illness in his family.   Social History:  Social History   Substance and Sexual Activity  Alcohol Use No     Social History   Substance and Sexual Activity  Drug Use No    Social History   Socioeconomic History  . Marital status: Married    Spouse name: None  . Number of children: None  . Years of education: None  . Highest education level: None  Social Needs  . Financial resource strain: None  . Food insecurity - worry: None  . Food insecurity - inability: None  . Transportation needs - medical: None  . Transportation needs - non-medical: None  Occupational History  . None  Tobacco Use  . Smoking status: Never Smoker  . Smokeless tobacco: Never Used  Substance and Sexual Activity  . Alcohol use: No  . Drug use: No  . Sexual activity: None  Other Topics Concern  . None  Social History Narrative  . None   Additional Social History:                         Sleep: Fair  Appetite:  Fair  Current Medications: Current Facility-Administered Medications  Medication Dose Route Frequency Provider Last Rate Last Dose  . acetaminophen (TYLENOL) tablet 650 mg  650 mg Oral Q6H PRN Rankin, [name], NP  650 mg at 05/05/17 0815  . alum & mag hydroxide-simeth (MAALOX/MYLANTA) 200-200-20 MG/5ML suspension 30 mL  30 mL Oral Q4H PRN Rankin, [name], NP   30 mL at 05/04/17 2109  . amLODipine (NORVASC) tablet 5 mg  5 mg Oral Daily Nira ConnBerry, Jason A, NP   5 mg at 05/05/17 0815  . hydrOXYzine (ATARAX/VISTARIL) tablet 25 mg  25 mg Oral TID PRN Rankin, [name], NP   25 mg at 05/04/17 2111  . pantoprazole (PROTONIX) EC tablet 20 mg  20 mg Oral Daily [name], Delight OvensVincent A, MD   20 mg at 05/05/17 0815  . sertraline (ZOLOFT) tablet 25 mg  25 mg Oral Daily [name], Delight OvensVincent A, MD   25 mg at 05/05/17  0815    Lab Results: No results found for this or any previous visit (from the past 48 hour(s)).  Blood Alcohol level:  Lab Results  Component Value Date   ETH <10 04/30/2017    Metabolic Disorder Labs: No results found for: HGBA1C, MPG No results found for: PROLACTIN Lab Results  Component Value Date   CHOL  08/28/2010    147        ATP III CLASSIFICATION:  <200     mg/dL   Desirable  119-147200-239  mg/dL   Borderline High  >=829>=240    mg/dL   High          TRIG 562154 (H) 08/28/2010   HDL 27 (L) 08/28/2010   CHOLHDL 5.4 08/28/2010   VLDL 31 08/28/2010   LDLCALC  08/28/2010    89        Total Cholesterol/HDL:CHD Risk Coronary Heart Disease Risk Table                     Men   Women  1/2 Average Risk   3.4   3.3  Average Risk       5.0   4.4  2 X Average Risk   9.6   7.1  3 X Average Risk  23.4   11.0        Use the calculated Patient Ratio above and the CHD Risk Table to determine the patient's CHD Risk.        ATP III CLASSIFICATION (LDL):  <100     mg/dL   Optimal  130-865100-129  mg/dL   Near or Above                    Optimal  130-159  mg/dL   Borderline  784-696160-189  mg/dL   High  >295>190     mg/dL   Very High    Physical Findings: AIMS: Facial and Oral Movements Muscles of Facial Expression: None, normal Lips and Perioral Area: None, normal Jaw: None, normal Tongue: None, normal,Extremity Movements Upper (arms, wrists, hands, fingers): None, normal Lower (legs, knees, ankles, toes): None, normal, Trunk Movements Neck, shoulders, hips: None, normal, Overall Severity Severity of abnormal movements (highest score from questions above): None, normal Incapacitation due to abnormal movements: None, normal Patient's awareness of abnormal movements (rate only patient's report): No Awareness, Dental Status Current problems with teeth and/or dentures?: No Does patient usually wear dentures?: No  CIWA:    COWS:     Musculoskeletal: Strength & Muscle Tone: within normal  limits Gait & Station: normal Patient leans: N/A  Psychiatric Specialty Exam: Physical Exam  ROS  Blood pressure (!) 134/98, pulse 70, temperature 97.9 F (36.6 C), temperature source Oral, resp. rate  16, height 5' 4.25" (1.632 m), weight 84.8 kg (187 lb).Body mass index is 31.85 kg/m.  General Appearance: Fairly Groomed  Eye Contact:  Fair  Speech:  Clear and Coherent and Normal Rate  Volume:  Normal  Mood:  Depressed  Affect:  Depressed  Thought Process:  Linear and Descriptions of Associations: Intact  Orientation:  Full (Time, Place, and Person)  Thought Content:  Logical  Suicidal Thoughts:  No  Homicidal Thoughts:  No  Memory:  Immediate;   Fair Recent;   Fair  Judgement:  Fair  Insight:  Lacking  Psychomotor Activity:  Decreased  Concentration:  Concentration: Fair and Attention Span: Fair  Recall:  [employer] of Knowledge:  Fair  Language:  Fair  Akathisia:  No  Handed:  Right  AIMS (if indicated):     Assets:  Communication Skills Desire for Improvement Financial Resources/Insurance Leisure Time Physical Health Vocational/Educational  ADL's:  Intact  Cognition:  WNL  Sleep:  Number of Hours: 6.25     Treatment Plan Summary: Daily contact with patient to assess and evaluate symptoms and progress in treatment and Medication management   Sertraline 25 mg daily 2. Continue medical home medications.  3. Encourage unit groups and activities 4. Monitor mood, behavior and interaction with peers 5. SW would coordinate aftercare and gather collateral from his family.    Truman Hayward, FNP 05/05/2017, 3:17 PM

## 2017-05-05 NOTE — Progress Notes (Signed)
Did not attend group, remained in room.

## 2017-05-05 NOTE — Progress Notes (Signed)
D Patient is pleasant upon approach. He is shy-like and has a gentle, flat demeanor. He takes his  Scheduled meds as planned and voices no complaints. A HE completed his daily assessment and on this  He wrote he denied SI today and he rated his depression, hopelessness and anxeity " 0/0/8", respectively. R Safety is in place and poc includes continuing to foster therapeutic relationship.

## 2017-05-05 NOTE — [facility] Group Notes (Signed)
[facility] Group Notes:  (Nursing/MHT/Case Management/Adjunct)  Date:  05/05/2017  Time:  7:57 PM  Type of Therapy:  Nurse Education  /  The group focuses on teaching patients how to identify their needs and then how to develop skills needed to get them emt.   Participation Level:  Minimal  Participation Quality:  Attentive  Affect:  Appropriate  Cognitive:  Alert  Insight:  Limited  Engagement in Group:  Engaged  Modes of Intervention:  Education  Summary of Progress/Problems:  Bradley BraveDuke, [name] 05/05/2017, 7:57 PM

## 2017-05-06 MED ORDER — SERTRALINE HCL 50 MG PO TABS
50.0000 mg | ORAL_TABLET | Freq: Every day | ORAL | Status: DC
  Administered 2017-05-07: 50 mg via ORAL
  Filled 2017-05-06 (×2): qty 1

## 2017-05-06 MED ORDER — SERTRALINE HCL 25 MG PO TABS
25.0000 mg | ORAL_TABLET | Freq: Once | ORAL | Status: AC
  Administered 2017-05-06: 25 mg via ORAL
  Filled 2017-05-06: qty 1

## 2017-05-06 MED ORDER — TRAZODONE HCL 50 MG PO TABS
50.0000 mg | ORAL_TABLET | Freq: Every evening | ORAL | Status: DC | PRN
  Administered 2017-05-06: 50 mg via ORAL
  Filled 2017-05-06 (×5): qty 1

## 2017-05-06 NOTE — [facility] Group Notes (Signed)
[facility] LCSW Group Therapy Note  Date/Time:  05/06/2017 10:00-11:00AM  Type of Therapy and Topic:  Group Therapy:  Healthy and Unhealthy Supports  Participation Level:  Minimal   Description of Group:  Patients in this group were introduced to the idea of adding a variety of healthy supports to address the various needs in their lives. The picture on the front of Sunday's workbook was used to demonstrate why more supports are needed in every patient's life.  Patients identified and described healthy supports versus unhealthy supports in general, then gave examples of each in their own lives.   They discussed what additional healthy supports could be helpful in their recovery and wellness after discharge in order to prevent future hospitalizations.   An emphasis was placed on using counselor, doctor, therapy groups, 12-step groups, and problem-specific support groups to expand supports.  They also worked as a group on developing a specific plan for several patients to deal with unhealthy supports through boundary-setting, psychoeducation with loved ones, and even termination of relationships.   Therapeutic Goals:   1)  discuss importance of adding supports to stay well once out of the hospital  2)  compare healthy versus unhealthy supports and identify some examples of each  3)  generate ideas and descriptions of healthy supports that can be added  4)  offer mutual support about how to address unhealthy supports  5)  encourage active participation in and adherence to discharge plan    Summary of Patient Progress:  The patient expressed a willingness to add an individual counselor and a family counselor to help in his recovery journey in 2019, and to reduce or work on eliminating "Nothing" as a negative support at the same time.  His participation was almost none and he showed little understanding of the discussion.  He was able to accept the information that individual counseling should be  pursued at least at the same time, if not before, family counseling.   Therapeutic Modalities:   Motivational Interviewing Brief Solution-Focused Therapy  Ambrose MantleMareida Grossman-Orr, LCSW

## 2017-05-06 NOTE — Progress Notes (Signed)
D: Pt was in his room with visitor upon initial approach.  Pt presents with appropriate affect and mood.  Pt reports he the beginning of his day there was "too much going on, but now I'm better." Pt denies SI/HI, denies hallucinations, denies pain.  His goal is to "sleep well" tonight.  He reports he did not sleep soundly last night.  Reports he had a good visit with his brother and identifies brother as a supportive person in his life.  Pt has been visible in milieu interacting with peers and staff appropriately.  Pt attended evening group.    A: Introduced self to pt.  Actively listened to pt and offered support and encouragement.  On-site provider contacted for additional orders for sleep medication.  Trazodone 50 mg PO was ordered and administered.  Q15 minute safety checks maintained.  R: Pt is safe on the unit.  Pt is compliant with medication.  Pt verbally contracts for safety.  Will continue to monitor and assess.

## 2017-05-06 NOTE — Progress Notes (Signed)
[facility] MD Progress Note  05/06/2017 11:03 AM [name]  MRN:  [id number] Subjective:   57 y.o Hispanic male, married, lives with his family, works as a [employer] architect. No past history of mental illness. Presented to the ER to the ER in company of his brother. Overdosed on his antihypertensive medications. Main stressors is difficulties at home with his family. No alcohol or substance use. Routine labs significant for low potassium, anemia and moderately low GFR.  Chart reviewed today. Patient discussed at team today.  Staff reports he has been doing well. Good group participation. He has not voiced any futility thoughts. Sleeps well and eats well. He has been tolerating his medications well. No behavioral issues.   Seen today. Says his wife and family returned back to [facility] yesterday. Says his wife is upset with him. She has not called or visited. Says she asked his daughter to find out where their car keys are. Patient says he has been getting a lot of support from his brother, mother inlaws and sister in law. Says they have been taking care of their puppies while he was here. Patient says he is still hopeful they can work out their differences in counseling. Says however if she does not want to partake in that, he plans to move on. Patient says it is not an easy decision but he has to do what is best for his daughter.  Patient denies any lingering suicidal thoughts. Says he knows that is not an option. He does not have any homicidal thoughts. He does not have any thoughts of violence. Patient plans to take some time off and then go back to work. Says he has been coping well here. He is eating and sleeping well. His energy levels are good. Says he is able to think clearly. No evidence of psychosis. No evidence of mania.   Principal Problem: MDD (major depressive disorder), severe (HCC) Diagnosis:   Patient Active Problem List   Diagnosis Date Noted  . MDD (major depressive disorder), severe (HCC)  [F32.2] 05/03/2017  . Adjustment disorder with mixed disturbance of emotions and conduct [F43.25] 05/01/2017  . Suicide attempt (HCC) [T14.91XA] 04/30/2017  . Hypotension due to medication [I95.2] 04/30/2017  . AKI (acute kidney injury) (HCC) [N17.9] 04/30/2017  . Essential hypertension [I10] 08/22/2016  . Chest pain [R07.9] 08/22/2016   Total Time spent with patient: 20 minutes  Past Psychiatric History: As in H&P  Past Medical History:  Past Medical History:  Diagnosis Date  . GERD (gastroesophageal reflux disease)   . Hypertension     Past Surgical History:  Procedure Laterality Date  . ESOPHAGEAL DILATION     Family History:  Family History  Problem Relation Age of Onset  . Hypertension Mother   . Diabetes Mother   . Hypertension Father    Family Psychiatric  History: As in H&P Social History:  Social History   Substance and Sexual Activity  Alcohol Use No     Social History   Substance and Sexual Activity  Drug Use No    Social History   Socioeconomic History  . Marital status: Married    Spouse name: None  . Number of children: None  . Years of education: None  . Highest education level: None  Social Needs  . Financial resource strain: None  . Food insecurity - worry: None  . Food insecurity - inability: None  . Transportation needs - medical: None  . Transportation needs - non-medical: None  Occupational History  .  None  Tobacco Use  . Smoking status: Never Smoker  . Smokeless tobacco: Never Used  Substance and Sexual Activity  . Alcohol use: No  . Drug use: No  . Sexual activity: None  Other Topics Concern  . None  Social History Narrative  . None   Additional Social History:      Sleep: Good  Appetite:  Good  Current Medications: Current Facility-Administered Medications  Medication Dose Route Frequency Provider Last Rate Last Dose  . acetaminophen (TYLENOL) tablet 650 mg  650 mg Oral Q6H PRN Rankin, [name], NP   650 mg at  05/05/17 0815  . alum & mag hydroxide-simeth (MAALOX/MYLANTA) 200-200-20 MG/5ML suspension 30 mL  30 mL Oral Q4H PRN Rankin, [name], NP   30 mL at 05/04/17 2109  . amLODipine (NORVASC) tablet 5 mg  5 mg Oral Daily Nira Conn A, NP   5 mg at 05/06/17 0805  . hydrOXYzine (ATARAX/VISTARIL) tablet 25 mg  25 mg Oral TID PRN Rankin, [name], NP   25 mg at 05/05/17 2100  . pantoprazole (PROTONIX) EC tablet 20 mg  20 mg Oral Daily Sammy Cassar, Delight Ovens, MD   20 mg at 05/06/17 0805  . sertraline (ZOLOFT) tablet 25 mg  25 mg Oral Daily Melbourne Jakubiak, Delight Ovens, MD   25 mg at 05/06/17 0805    Lab Results: No results found for this or any previous visit (from the past 48 hour(s)).  Blood Alcohol level:  Lab Results  Component Value Date   ETH <10 04/30/2017    Metabolic Disorder Labs: No results found for: HGBA1C, MPG No results found for: PROLACTIN Lab Results  Component Value Date   CHOL  08/28/2010    147        ATP III CLASSIFICATION:  <200     mg/dL   Desirable  409-811  mg/dL   Borderline High  >=914    mg/dL   High          TRIG 782 (H) 08/28/2010   HDL 27 (L) 08/28/2010   CHOLHDL 5.4 08/28/2010   VLDL 31 08/28/2010   LDLCALC  08/28/2010    89        Total Cholesterol/HDL:CHD Risk Coronary Heart Disease Risk Table                     Men   Women  1/2 Average Risk   3.4   3.3  Average Risk       5.0   4.4  2 X Average Risk   9.6   7.1  3 X Average Risk  23.4   11.0        Use the calculated Patient Ratio above and the CHD Risk Table to determine the patient's CHD Risk.        ATP III CLASSIFICATION (LDL):  <100     mg/dL   Optimal  956-213  mg/dL   Near or Above                    Optimal  130-159  mg/dL   Borderline  086-578  mg/dL   High  >469     mg/dL   Very High    Physical Findings: AIMS: Facial and Oral Movements Muscles of Facial Expression: None, normal Lips and Perioral Area: None, normal Jaw: None, normal Tongue: None, normal,Extremity Movements Upper  (arms, wrists, hands, fingers): None, normal Lower (legs, knees, ankles, toes): None, normal, Trunk Movements  Neck, shoulders, hips: None, normal, Overall Severity Severity of abnormal movements (highest score from questions above): None, normal Incapacitation due to abnormal movements: None, normal Patient's awareness of abnormal movements (rate only patient's report): No Awareness, Dental Status Current problems with teeth and/or dentures?: No Does patient usually wear dentures?: No  CIWA:    COWS:     Musculoskeletal: Strength & Muscle Tone: within normal limits Gait & Station: normal Patient leans: N/A  Psychiatric Specialty Exam: Physical Exam  Constitutional: He appears well-developed and well-nourished.  HENT:  Head: Normocephalic and atraumatic.  Respiratory: Effort normal.  Neurological: He is alert.  Psychiatric:  As above     ROS  Blood pressure (!) 136/103, pulse 79, temperature (!) 97.5 F (36.4 C), temperature source Oral, resp. rate 16, height 5' 4.25" (1.632 m), weight 84.8 kg (187 lb).Body mass index is 31.85 kg/m.  General Appearance: Neatly dressed. Good relatedness. Appropriate.  Eye Contact:  Good  Speech:  Clear and Coherent and Normal Rate  Volume:  Normal  Mood:  Less worried. Not depressed  Affect:  Appropriate and Congruent  Thought Process:  Linear  Orientation:  Full (Time, Place, and Person)  Thought Content:  No delusional theme. No preoccupation with violent thoughts. No negative ruminations. No obsession.  No hallucination in any modality.   Suicidal Thoughts:  No  Homicidal Thoughts:  No  Memory:  Immediate;   Good Recent;   Good Remote;   Good  Judgement:  Good  Insight:  Good  Psychomotor Activity:  Normal  Concentration:  Concentration: Good and Attention Span: Good  Recall:  Good  Fund of Knowledge:  Good  Language:  Good  Akathisia:  Negative  Handed:    AIMS (if indicated):     Assets:  Communication Skills Desire for  Improvement Financial Resources/Insurance Housing Resilience Transportation Vocational/Educational  ADL's:  Intact  Cognition:  WNL  Sleep:  Number of Hours: 6.25     Treatment Plan Summary: Patient is adjusting well to recent psychosocial stressors. He is processing ways of dealing with his stressors in a logical manner.  He is not a danger to himself or others. He is tolerating his medications well. We plan to adjust his medications further today. Hopeful discharge tomorrow.  Psychiatric: Adjustment disorder  ?MDD  Medical: HTN GERD CKD Anemia  Psychosocial:  Family dynamics  PLAN: 1. increase Sertraline 50 mg daily 2. Continue to monitor mood, behavior and interaction with peers 3. Collateral from his family     Georgiann CockerVincent A Palmer Shorey, MD 05/06/2017, 11:03 AM

## 2017-05-06 NOTE — Progress Notes (Signed)
D patient is observed taking his scheduled meds as ordered. He is polite. HE shakes his head " yes". He says " gracias" when Clinical research associatewriter explained that nurse was here to care for patient. He makes good eye contact with this Clinical research associatewriter. HE is calm. HE is engaged in the conversation . He spoke with this Clinical research associatewriter about his daughter. He explained she is " very pretty" and that he loves her " muy bueno ". A HE completed his daily assessment and on this he wrote he denied SI today and he rated his depression, hopelessness and anxiety " 8/10/8", respectively. He says " i'm feeling better today...even though things in my life are still pretty bad today". R Safety is in place.

## 2017-05-06 NOTE — Plan of Care (Signed)
  Progressing Coping: Ability to demonstrate self-control will improve 05/06/2017 2322 - Progressing by Arrie Aranhurch, Draycen Leichter J, RN Note Pt has maintained control of his behavior tonight.  He has interacted appropriately with peers and staff.

## 2017-05-07 MED ORDER — TRAZODONE HCL 50 MG PO TABS: 50.0000 mg | ORAL_TABLET | Freq: Every evening | ORAL | 0 refills | Status: DC | PRN

## 2017-05-07 MED ORDER — AMLODIPINE BESYLATE 5 MG PO TABS: 5.0000 mg | ORAL_TABLET | Freq: Every day | ORAL | 0 refills | Status: DC

## 2017-05-07 MED ORDER — SERTRALINE HCL 50 MG PO TABS: 50.0000 mg | ORAL_TABLET | Freq: Every day | ORAL | 0 refills | Status: DC

## 2017-05-07 MED ORDER — HYDROXYZINE HCL 25 MG PO TABS: 25.0000 mg | ORAL_TABLET | Freq: Three times a day (TID) | ORAL | 0 refills | Status: DC | PRN

## 2017-05-07 NOTE — Progress Notes (Signed)
  [facility] :  Will you be returning to the same living situation after discharge:  Yes,  own home At discharge, do you have transportation home?: Yes,  wife Do you have the ability to pay for your medications: Yes,  BCBS  Release of information consent forms completed and in the chart;  Patient's signature needed at discharge.  Patient to Follow up at: Follow-up Information    [facility] Go on 05/14/2017.   Why:  Please attend your therapy appt on Monday, 05/14/17, at 10:30am.  Please attend your medication appt on Monday, 07/09/17 at 1pm.  Please contact [facility] to provide insurance card and pay initial $20 fee towards first session. Contact information: [address] [place] [id number] [phone]           Next level of care provider [facility] Link:no  Safety Planning and Suicide Prevention discussed: No. Pt declined.  Have you used any form of tobacco in the last 30 days? (Cigarettes, Smokeless Tobacco, Cigars, and/or Pipes): No  Has patient been referred to the Quitline?: N/A patient is not a smoker  Patient has been referred for addiction treatment: Yes  Lorri FrederickWierda, Amber Williard Jon, LCSW 05/07/2017, 2:09 PM

## 2017-05-07 NOTE — Progress Notes (Signed)
Pt received both written and verbal discharge instructions. Pt verbalized understanding of discharge instructions. Pt agreed to f/u appt and med regimen. Pt received d/c forms and prescriptions. Pt safely discharged to the lobby.

## 2017-05-07 NOTE — [facility] Suicide Risk Assessment (Signed)
[facility]   Principal Problem: MDD (major depressive disorder), severe [facility](HCC) Discharge Diagnoses:  Patient Active Problem List   Diagnosis Date Noted  . MDD (major depressive disorder), severe (HCC) [F32.2] 05/03/2017  . Adjustment disorder with mixed disturbance of emotions and conduct [F43.25] 05/01/2017  . Suicide attempt (HCC) [T14.91XA] 04/30/2017  . Hypotension due to medication [I95.2] 04/30/2017  . AKI (acute kidney injury) (HCC) [N17.9] 04/30/2017  . Essential hypertension [I10] 08/22/2016  . Chest pain [R07.9] 08/22/2016    Total Time spent with patient: 45 minutes  Musculoskeletal: Strength & Muscle Tone: within normal limits Gait & Station: normal Patient leans: N/A  Psychiatric Specialty Exam: Review of Systems  Constitutional: Negative.   HENT: Negative.   Eyes: Negative.   Respiratory: Negative.   Cardiovascular: Negative.   Gastrointestinal: Negative.   Genitourinary: Negative.   Musculoskeletal: Negative.   Skin: Negative.   Neurological: Negative.   Endo/Heme/Allergies: Negative.   Psychiatric/Behavioral: Negative for depression, hallucinations, memory loss, substance abuse and suicidal ideas. The patient is not nervous/anxious and does not have insomnia.     Blood pressure (!) 143/105, pulse 78, temperature 98 F (36.7 C), temperature source Oral, resp. rate 16, height 5' 4.25" (1.632 m), weight 84.8 kg (187 lb).Body mass index is 31.85 kg/m.  General Appearance: Neatly dressed, pleasant, engaging well and cooperative. Appropriate behavior. Not in any distress. Good relatedness. Not internally stimulated  Eye Contact::  Good  Speech:  Spontaneous, normal prosody. Normal tone and rate.   Volume:  Normal  Mood:  Euthymic  Affect:  Appropriate and Full Range  Thought Process:  Goal Directed and Linear  Orientation:  Full (Time, Place, and Person)  Thought Content:  Future oriented. No delusional theme. No preoccupation with  violent thoughts. No negative ruminations. No obsession.  No hallucination in any modality.   Suicidal Thoughts:  No  Homicidal Thoughts:  No  Memory:  Immediate;   Good Recent;   Good Remote;   Good  Judgement:  Good  Insight:  Good  Psychomotor Activity:  Normal  Concentration:  Good  Recall:  Good  Fund of Knowledge:Good  Language: Good  Akathisia:  Negative  Handed:    AIMS (if indicated):     Assets:  Communication Skills Desire for Improvement Financial Resources/Insurance Housing Physical Health Resilience Social Support Talents/Skills Transportation Vocational/Educational  Sleep:  Number of Hours: 6.75  Cognition: WNL  ADL's:  Intact   Clinical Assessment::   57y.o Hispanic male, married, lives with his family, works as a Naval architecttruck driver.No past history of mental illness. [facility]. Overdosed on his antihypertensive medications. Main stressorsis difficulties at home with his family. No alcohol or substance use.Routine labssignificant for low potassium, anemia and moderately low GFR.  Seen today. Says his brother Jonetta SpeakLuis would be picking him up. He has decided to stay with his brother until they can work things out at home. Says his wife is still drinking and partying with her friend. Says he wants to give her space so she can make up her mind what she wants. Patient wants to take a few days off work and then go back to work. [place] but I was unable to reach him. We would get collateral from [place] when he comes to pick patient up.  Reports that he is in good spirits. Not feeling depressed. Reports normal energy and interest. Has been maintaining normal biological functions. He is able  to think clearly. He is able to focus on task. His thoughts are not crowded or racing. No evidence of mania. No hallucination in any modality. He is not making any delusional statement. No passivity of will/thought. He is fully in touch  with reality. No thoughts of suicide. No thoughts of homicide. No violent thoughts. No overwhelming anxiety. No access to weapons.  Nursing staff reports that patient has been appropriate on the unit. Patient has been interacting well with peers. No behavioral issues. Patient has not voiced any suicidal thoughts. Patient has not been observed to be internally stimulated. Patient has been adherent with treatment recommendations. Patient has been tolerating their medication well.   Patient was discussed at team. Team members feels that patient is back to his baseline level of function. Team agrees with plan to discharge patient today.   Demographic Factors:  Male  Loss Factors: Loss of significant relationship  Historical Factors: NA  Risk Reduction Factors:   Responsible for children under 65 years of age, Sense of responsibility to family, Religious beliefs about death, Employed, Living with another person, especially a relative, Positive social support, Positive therapeutic relationship and Positive coping skills or problem solving skills  Continued Clinical Symptoms:  As above   Cognitive Features That Contribute To Risk:  None    Suicide Risk:  Minimal: No identifiable suicidal ideation.  Patient is not having any thoughts of suicide at this time. Modifiable risk factors targeted during this admission includes adjustment disorder and depression. Demographical and historical risk factors cannot be modified. Patient is now engaging well. Patient is reliable and is future oriented. We have buffered patient's support structures. At this point, patient is at low risk of suicide. Patient is aware of the effects of psychoactive substances on decision making process. Patient has been provided with emergency contacts. Patient acknowledges to use resources provided if unforseen circumstances changes their current risk stratification.    Follow-up Information    [facility] Go on  05/14/2017.   Why:  Please attend your therapy appt on Monday, 05/14/17, at 10:30am.  Please attend your medication appt on Monday, 07/09/17 at 1pm.  Please contact [facility] upon discharge from [facility] to provide insurance card and pay initial $20 fee towards first session. Contact information: [address] [place] [address] [phone]           Plan Of Care/Follow-up recommendations:  1. Continue current psychotropic medications 2. Mental health and addiction follow up as arranged.  3. Discharge in care of his brother 4. Provided limited quantity of prescriptions   Georgiann Cocker, MD 05/07/2017, 10:22 AM

## 2017-05-07 NOTE — Progress Notes (Signed)
Recreation Therapy Notes  Date: 05/07/17 Time: 0930 Location: [address] Hall Dayroom  Group Topic: Stress Management  Goal Area(s) Addresses:  Patient will verbalize importance of using healthy stress management.  Patient will identify positive emotions associated with healthy stress management.   Intervention: Stress Management  Activity :  LRT introduced the stress management technique of meditation.  LRT played Frye meditation to help patients deal with anxiety.  Patients were to follow along to fully engage in the meditation.  Education:  Stress Management, Discharge Planning.   Education Outcome: Acknowledges edcuation/In group clarification offered/Needs additional education  Clinical Observations/Feedback: Pt did not attend group.    [name], LRT/CTRS         [name] 05/07/2017 11:14 AM 

## 2017-05-07 NOTE — Progress Notes (Signed)
Pt gave consent for this writer to speak with his brother Melvyn NethLewis to gathered collateral information. Lewis (pt's brother) stated that he feels that [name] is ready for discharge today. He stated that [facility], he sounded great and plans to come and live with him in his home. Lewis agreed to remove any firearms in his home and to provide a safe environment for the pt. Writer made Dr. Jackquline BerlinIzediuno and Feliz Beamravis, Np., aware of information gathered.

## 2017-05-07 NOTE — Discharge Summary (Signed)
Physician Discharge Summary Note  Patient:  [name] is an 57 y.o., male MRN:  [id number] DOB:  [birth year] Patient phone:  [phone] (home)  Patient address:   [address] [place] [address],  Total Time spent with patient: 20 minutes  Date of Admission:  05/03/2017 Date of Discharge: 05/07/17   Reason for Admission:  Worsening depression with overdose attempt   Principal Problem: MDD (major depressive disorder), severe [facility]) Discharge Diagnoses: Patient Active Problem List   Diagnosis Date Noted  . MDD (major depressive disorder), severe (HCC) [F32.2] 05/03/2017  . Adjustment disorder with mixed disturbance of emotions and conduct [F43.25] 05/01/2017  . Suicide attempt (HCC) [T14.91XA] 04/30/2017  . Hypotension due to medication [I95.2] 04/30/2017  . AKI (acute kidney injury) (HCC) [N17.9] 04/30/2017  . Essential hypertension [I10] 08/22/2016  . Chest pain [R07.9] 08/22/2016    Past Psychiatric History: No past history of mental health treatment. No past history of mania. No past history of psychosis. No past history of suicidal behavior. No past history of violent behavior   Past Medical History:  Past Medical History:  Diagnosis Date  . GERD (gastroesophageal reflux disease)   . Hypertension     Past Surgical History:  Procedure Laterality Date  . ESOPHAGEAL DILATION     Family History:  Family History  Problem Relation Age of Onset  . Hypertension Mother   . Diabetes Mother   . Hypertension Father    Family Psychiatric  History: Has uncles in [place] that committed suicide. No formal diagnosis of mental illness in his family  Social History:  Social History   Substance and Sexual Activity  Alcohol Use No     Social History   Substance and Sexual Activity  Drug Use No    Social History   Socioeconomic History  . Marital status: Married    Spouse name: None  . Number of children: None  . Years of education: None  . Highest  education level: None  Social Needs  . Financial resource strain: None  . Food insecurity - worry: None  . Food insecurity - inability: None  . Transportation needs - medical: None  . Transportation needs - non-medical: None  Occupational History  . None  Tobacco Use  . Smoking status: Never Smoker  . Smokeless tobacco: Never Used  Substance and Sexual Activity  . Alcohol use: No  . Drug use: No  . Sexual activity: None  Other Topics Concern  . None  Social History Narrative  . None    Hospital Course:   05/04/17 [facility] MD Assessment: 57 y.o Hispanic male, married, lives with his family, works as a Naval architect. No past history of mental illness. Presented to the ER to the ER in company of his brother. Overdosed on his antihypertensive medications. Main stressors is difficulties at home with his family. No alcohol or substance use. Routine labs significant for low potassium, anemia and moderately low GFR. At interview, patient states that he is the sole provider in his family. Says he has been functioning well at work and at home. Patient says that recently his wife has been acting differently. Says she newly brought in a friend into the house. This friend loves to drink and smoke. Patient states that they are usually loud up till 03:00 AM. Patient says his wife knows he has to be up at 04:00 AM. Says he repeatedly appealed to her to end the habit but she refused. Patient states that recently his  wife made arrangements for the family to go on vacation to [place]. Says he took time off to join them only to find out that they had rescheduled to trip for [facility]. Says when he found out he confronted her and she cancelled the trip. Patient states that he went back to work but got a phone call from his daughter that they were in [place]. Says he felt betrayed. Says he tried to talk to his wife but she would not take the call. Patient states that he then sent her a text that he was going to report that  his daughter has been kidnapped. Says she responded then and they had an argument. Patient later got a call from his step son who is currently incarcerated. He threatened to "fuck him up". Patient says he became very upset. He started ruminating about the death of his father six months ago. He felt alone. He felt there was no point going on this ways. Says  he impulsively took his medications and went to sleep. Says he is not sure how many he took. Says he went to bed. His brother called him later and noticed that he sounded different over the phone. His brother then came to his house and brought him to the hospital. Patient states that he regrets his actions. Says he has a lot to live for. Says he has never felt this way before. Says he has never acted this way before. Patient says his goal is to get his family back on track. Says he plans to get into counseling with his wife.  No evidence of psychosis. No evidence of mania. Patient is not pervasively depressed. No thoughts of violence. No homicidal thoughts. Says he hopes to get back to work soon. He does not have access to weapons.   Patient remained on the [facility] unit for 3 days and stabilized with medications and therapy. Patient was started on Zoloft and titrated to 50 mg Daily and used Trazodone and Vistaril PRN during the visit. Patient showed improvement with improved mood, affect, sleep, appetite, and interaction. Patient was seen in the day room interacting with peers and staff appropriately. Patient attneded groups. Patient denies any SI/HI/AVH and contracts for safety. [facility]. He is provided with prescriptions for his medications upon discharge.     Physical Findings: AIMS: Facial and Oral Movements Muscles of Facial Expression: None, normal Lips and Perioral Area: None, normal Jaw: None, normal Tongue: None, normal,Extremity Movements Upper (arms, wrists, hands, fingers): None, normal Lower (legs,  knees, ankles, toes): None, normal, Trunk Movements Neck, shoulders, hips: None, normal, Overall Severity Severity of abnormal movements (highest score from questions above): None, normal Incapacitation due to abnormal movements: None, normal Patient's awareness of abnormal movements (rate only patient's report): No Awareness, Dental Status Current problems with teeth and/or dentures?: No Does patient usually wear dentures?: No  CIWA:    COWS:     Musculoskeletal: Strength & Muscle Tone: within normal limits Gait & Station: normal Patient leans: N/A  Psychiatric Specialty Exam: Physical Exam  Nursing note and vitals reviewed. Constitutional: He appears well-developed and well-nourished.  Cardiovascular: Normal rate.  Respiratory: Effort normal.  Musculoskeletal: Normal range of motion.  Neurological: He is alert.  Skin: Skin is warm.    Review of Systems  Constitutional: Negative.   HENT: Negative.   Eyes: Negative.   Respiratory: Negative.   Cardiovascular: Negative.   Gastrointestinal: Negative.   Genitourinary: Negative.  Musculoskeletal: Negative.   Skin: Negative.   Neurological: Negative.   Endo/Heme/Allergies: Negative.   Psychiatric/Behavioral: Negative.     Blood pressure (!) 143/105, pulse 78, temperature 98 F (36.7 C), temperature source Oral, resp. rate 16, height 5' 4.25" (1.632 m), weight 84.8 kg (187 lb).Body mass index is 31.85 kg/m.  General Appearance: Casual  Eye Contact:  Good  Speech:  Clear and Coherent and Normal Rate  Volume:  Normal  Mood:  Euthymic  Affect:  Appropriate  Thought Process:  Goal Directed and Descriptions of Associations: Intact  Orientation:  Full (Time, Place, and Person)  Thought Content:  WDL  Suicidal Thoughts:  No  Homicidal Thoughts:  No  Memory:  Immediate;   Good Recent;   Good Remote;   Good  Judgement:  Good  Insight:  Good  Psychomotor Activity:  Normal  Concentration:  Concentration: Good and Attention  Span: Good  Recall:  Good  Fund of Knowledge:  Good  Language:  Good  Akathisia:  No  Handed:  Right  AIMS (if indicated):     Assets:  Communication Skills Desire for Improvement Financial Resources/Insurance Housing Physical Health Social Support Transportation  ADL's:  Intact  Cognition:  WNL  Sleep:  Number of Hours: 6.75     Have you used any form of tobacco in the last 30 days? (Cigarettes, Smokeless Tobacco, Cigars, and/or Pipes): No  Has this patient used any form of tobacco in the last 30 days? (Cigarettes, Smokeless Tobacco, Cigars, and/or Pipes) Yes, No  Blood Alcohol level:  Lab Results  Component Value Date   ETH <10 04/30/2017    Metabolic Disorder Labs:  No results found for: HGBA1C, MPG No results found for: PROLACTIN Lab Results  Component Value Date   CHOL  08/28/2010    147        ATP III CLASSIFICATION:  <200     mg/dL   Desirable  [id number]  mg/dL   Borderline High  >=914>=240    mg/dL   High          TRIG 782154 (H) 08/28/2010   HDL 27 (L) 08/28/2010   CHOLHDL 5.4 08/28/2010   VLDL 31 08/28/2010   LDLCALC  08/28/2010    89        Total Cholesterol/HDL:CHD Risk Coronary Heart Disease Risk Table                     Men   Women  1/2 Average Risk   3.4   3.3  Average Risk       5.0   4.4  2 X Average Risk   9.6   7.1  3 X Average Risk  23.4   11.0        Use the calculated Patient Ratio above and the CHD Risk Table to determine the patient's CHD Risk.        ATP III CLASSIFICATION (LDL):  <100     mg/dL   Optimal  [id number]  mg/dL   Near or Above                    Optimal  130-159  mg/dL   Borderline  [id number]  mg/dL   High  >469>190     mg/dL   Very High    See Psychiatric Specialty Exam and Suicide Risk Assessment completed by Attending Physician prior to discharge.  Discharge destination:  Home  Is patient on multiple antipsychotic therapies at discharge:  No   Has Patient had three or more failed trials of antipsychotic monotherapy by  history:  No  Recommended Plan for Multiple Antipsychotic Therapies: NA   Allergies as of 05/07/2017   No Known Allergies     Medication List    STOP taking these medications   cetirizine 10 MG tablet Commonly known as:  ZYRTEC   chlorproMAZINE 25 MG tablet Commonly known as:  THORAZINE   guaiFENesin 600 MG 12 hr tablet Commonly known as:  MUCINEX   multivitamin with minerals Tabs tablet   omeprazole 20 MG capsule Commonly known as:  PRILOSEC   vitamin C 500 MG tablet Commonly known as:  ASCORBIC ACID     TAKE these medications     Indication  amLODipine 5 MG tablet Commonly known as:  NORVASC Take 1 tablet (5 mg total) by mouth daily. For high blood pressure Start taking on:  05/08/2017 What changed:  additional instructions  Indication:  High Blood Pressure Disorder   hydrOXYzine 25 MG tablet Commonly known as:  ATARAX/VISTARIL Take 1 tablet (25 mg total) by mouth 3 (three) times daily as needed for anxiety (sleep).  Indication:  Feeling Anxious   sertraline 50 MG tablet Commonly known as:  ZOLOFT Take 1 tablet (50 mg total) by mouth daily. For mood control Start taking on:  05/08/2017  Indication:  mood stability   traZODone 50 MG tablet Commonly known as:  DESYREL Take 1 tablet (50 mg total) by mouth at bedtime as needed for sleep.  Indication:  Trouble Sleeping      Follow-up Information    [facility] Go on 05/14/2017.   Why:  Please attend your therapy appt on Monday, 05/14/17, at 10:30am.  Please attend your medication appt on Monday, 07/09/17 at 1pm.  Please contact [facility] to provide insurance card and pay initial $20 fee towards first session. Contact information: [address] [place] [address] [phone]           Follow-up recommendations:  Continue activity as tolerated. Continue diet as recommended by your PCP. Ensure to keep all appointments with outpatient providers.  Comments:   Patient is instructed prior to discharge to: Take all medications as prescribed by his/her mental healthcare provider. Report any adverse effects and or reactions from the medicines to his/her outpatient provider promptly. Patient has been instructed & cautioned: To not engage in alcohol and or illegal drug use while on prescription medicines. In the event of worsening symptoms, patient is instructed to call the crisis hotline, 911 and or go to the nearest ED for appropriate evaluation and treatment of symptoms. To follow-up with his/her primary care provider for your other medical issues, concerns and or health care needs.    Signed: Gerlene Burdock Gorgeous [place], FNP 05/07/2017, 8:49 AM

## 2017-05-07 NOTE — [facility] Group Notes (Signed)
Pt attended and participated in wrap up group and rated their day an 8/10. Pt goal was to go to meetings and they achieved their goal. A positive noted by the pt was that their brother visited.

## 2017-05-11 NOTE — Progress Notes (Signed)
[facility].  CSW filled it out, Dr Jama Flavorsobos signed it and it was faxed.  CSW spoke with pt prior to faxing to let him know it was complete. Garner NashGregory Asencion Loveday, MSW, LCSW Clinical Social Worker 05/11/2017 2:29 PM

## 2018-07-23 ENCOUNTER — Other Ambulatory Visit: Payer: Self-pay

## 2018-07-23 ENCOUNTER — Encounter (HOSPITAL_COMMUNITY): Payer: Self-pay | Admitting: Emergency Medicine

## 2018-07-23 ENCOUNTER — Ambulatory Visit (HOSPITAL_COMMUNITY)
Admission: EM | Admit: 2018-07-23 | Discharge: 2018-07-23 | Disposition: A | Discharge status: Home / Self Care | Payer: BLUE CROSS/BLUE SHIELD | Attending: Family Medicine | Admitting: Family Medicine

## 2018-07-23 DIAGNOSIS — I10 Essential (primary) hypertension: Secondary | ICD-10-CM

## 2018-07-23 DIAGNOSIS — F418 Other specified anxiety disorders: Secondary | ICD-10-CM

## 2018-07-23 MED ORDER — LORAZEPAM 0.5 MG PO TABS: 0.5000 mg | ORAL_TABLET | Freq: Three times a day (TID) | ORAL | 0 refills | Status: DC

## 2018-07-23 NOTE — ED Notes (Signed)
Patient verbalizes understanding of discharge instructions. Opportunity for questioning and answers were provided. Patient discharged from [facility] by MD. 

## 2018-07-23 NOTE — Discharge Instructions (Signed)
Be aware, the anxiety medication prescribed may cause drowsiness. Ple  Please do not drive, operate heavy machinery or make important decisions while on this medication, it can cloud your judgement.

## 2018-07-23 NOTE — ED Triage Notes (Signed)
Pt here for feeling poorly with htn after his son was removed from life support today; pt is tearful; pt sts takes htn meds and has been taking them

## 2018-07-24 NOTE — ED Provider Notes (Signed)
[facility]   [id number] 07/23/18 Arrival Time: 1323  ASSESSMENT & PLAN:  1. Essential hypertension   2. Situational anxiety    ECG without worrisome changes. No STEMI. NSR.  Meds ordered this encounter  Medications  . LORazepam (ATIVAN) 0.5 MG tablet    Sig: Take 1 tablet (0.5 mg total) by mouth every 8 (eight) hours. As needed for anxiety.    Dispense:  20 tablet    Refill:  0   [place] Controlled Substances Registry consulted for this patient. I feel the risk/benefit ratio today is favorable for proceeding with this prescription for a controlled substance. Medication sedation precautions given.  Will monitor his BP as an outpatient. No change in HTN tx today.  Follow-up Information    Schedule an appointment as soon as possible for a visit  with Jearld Lesch, MD.   Specialty:  Family Medicine Contact information: [address] [place] [address] [phone]        [name].   Specialty:  Urgent Care Why:  As needed. Contact information: [address] [place] [address] [phone]          Reviewed expectations re: course of current medical issues. Questions answered. Outlined signs and symptoms indicating need for more acute intervention. Patient verbalized understanding. After Visit Summary given.   SUBJECTIVE:  [name] is a 58 y.o. male who presents with concerns regarding increased blood pressures. He reports that he has been treated for hypertension in the past.  He reports taking medications as instructed, no medication side effects noted, no TIA's, no chest pain on exertion, no dyspnea on exertion, no swelling of ankles, no orthostatic dizziness or lightheadedness, no orthopnea or paroxysmal nocturnal dyspnea and no palpitations.  Reports his son is in the ICU from an MI; question drug overdose. Life support withdrawn this morning. He is very anxious and tearful over  this.  Social History   Tobacco Use  Smoking Status Never Smoker  Smokeless Tobacco Never Used    ROS: As per HPI.   OBJECTIVE:  Vitals:   07/23/18 1341  BP: (!) 153/100  Pulse: (!) 104  Resp: 18  Temp: 98.1 F (36.7 C)  TempSrc: Oral  SpO2: 95%    General appearance: alert; no distress but tearful Eyes: PERRLA; EOMI HENT: normocephalic; atraumatic Neck: supple Lungs: clear to auscultation bilaterally Heart: regular rate and rhythm without murmer Abdomen: soft, non-tender; bowel sounds normal Extremities: no edema; symmetrical with no gross deformities Skin: warm and dry Psychological: alert and cooperative; normal mood and affect  ECG: Orders placed or performed during the hospital encounter of 07/23/18  . ED EKG  . ED EKG   No Known Allergies  Past Medical History:  Diagnosis Date  . GERD (gastroesophageal reflux disease)   . Hypertension    Social History   Socioeconomic History  . Marital status: Married    Spouse name: Not on file  . Number of children: Not on file  . Years of education: Not on file  . Highest education level: Not on file  Occupational History  . Not on file  Social Needs  . Financial resource strain: Not on file  . Food insecurity:    Worry: Not on file    Inability: Not on file  . Transportation needs:    Medical: Not on file    Non-medical: Not on file  Tobacco Use  . Smoking status: Never Smoker  .  Smokeless tobacco: Never Used  Substance and Sexual Activity  . Alcohol use: No  . Drug use: No  . Sexual activity: Not on file  Lifestyle  . Physical activity:    Days per week: Not on file    Minutes per session: Not on file  . Stress: Not on file  Relationships  . Social connections:    Talks on phone: Not on file    Gets together: Not on file    Attends religious service: Not on file    Active member of club or organization: Not on file    Attends meetings of clubs or organizations: Not on file    Relationship  status: Not on file  . Intimate partner violence:    Fear of current or ex partner: Not on file    Emotionally abused: Not on file    Physically abused: Not on file    Forced sexual activity: Not on file  Other Topics Concern  . Not on file  Social History Narrative  . Not on file   Family History  Problem Relation Age of Onset  . Hypertension Mother   . Diabetes Mother   . Hypertension Father    Past Surgical History:  Procedure Laterality Date  . ESOPHAGEAL DILATION        Mardella Layman, MD 07/24/18 [phone]

## 2018-09-06 ENCOUNTER — Other Ambulatory Visit: Payer: Self-pay | Admitting: Cardiovascular Disease

## 2019-01-10 ENCOUNTER — Other Ambulatory Visit: Payer: Self-pay | Admitting: Cardiovascular Disease

## 2019-05-16 ENCOUNTER — Other Ambulatory Visit: Payer: Self-pay | Admitting: Cardiovascular Disease

## 2020-03-19 ENCOUNTER — Other Ambulatory Visit: Payer: Self-pay | Admitting: Sports Medicine

## 2020-03-22 ENCOUNTER — Other Ambulatory Visit: Payer: Self-pay | Admitting: Sports Medicine

## 2020-03-22 DIAGNOSIS — T148XXA Other injury of unspecified body region, initial encounter: Secondary | ICD-10-CM

## 2020-05-27 NOTE — Progress Notes (Signed)
Cardiology Office Note:   Date:  05/28/2020  NAME:  [name]    MRN: [id number] DOB:  1960-09-17   PCP:  Jearld Lesch, MD  Cardiologist:  No primary care provider on file.  Electrophysiologist:  None   Referring MD: Mechele Dawley, PA-C   Chief Complaint  Patient presents with  . Chest Pain   History of Present Illness:   [name] is a 60 y.o. male with a hx of GERD, depression, hypertension who is being seen today for the evaluation of chest pain at the request of Jearld Lesch, MD.  History of major depressive disorder and hospitalized in 2018 for suicide attempt.  Negative nuclear medicine stress test in 2018.  Normal echocardiogram in 2018.  He reports he had COVID-19 infection on 05/04/2020.  Symptoms included cough, congestion, shortness of breath and aches.  He also reports has had pleuritic chest pain.  He reports when he takes a deep breath and he gets a sharp pain in his chest.  Symptoms resolved with breathing slowly.  Symptoms are improving.  He reports no increased lower extremity edema.  He reports his shortness of breath slowly improving.  His EKG in office demonstrates normal sinus rhythm with no acute ischemic changes or evidence of prior infarction.  A D-dimer was obtained by his primary care physician that was normal at 0.29.  He is not anemic with a hemoglobin of 16.1.  WBC 7.7.  He reports a chest x-ray was obtained that was normal.  I do not have the results of this.  He reports prior to Covid he was without any major issues.  He does have hypertension for which he takes amlodipine.  Blood pressure 118/81.  He also takes omeprazole for acid reflux.  He works as a Naval architect.  He presents with his wife today.  He is a never smoker.  He does not drink alcohol or use drugs.  He reports symptoms were occurring daily but now are occurring less frequently.  Overall things are improving.  Labs her primary care physician showed D-dimer 0.29, WBC 7.7, hemoglobin  16.1, platelets 282  Problem List 1. GERD 2. HTN 3. HLD -Total cholesterol 147, HDL 45, LDL 67, triglycerides 217  Past Medical History: Past Medical History:  Diagnosis Date  . GERD (gastroesophageal reflux disease)   . Hyperlipidemia   . Hypertension     Past Surgical History: Past Surgical History:  Procedure Laterality Date  . ESOPHAGEAL DILATION      Current Medications: Current Meds  Medication Sig  . albuterol (VENTOLIN HFA) 108 (90 Base) MCG/ACT inhaler albuterol sulfate HFA 90 mcg/actuation aerosol inhaler  . amLODipine (NORVASC) 5 MG tablet TAKE 1 TABLET BY MOUTH DAILY  . atorvastatin (LIPITOR) 10 MG tablet atorvastatin 10 mg tablet  TAKE 1 TABLET BY MOUTH EVERY DAY  . gabapentin (NEURONTIN) 100 MG capsule gabapentin 100 mg capsule  TAKE 1 CAPSULE BY MOUTH EVERY DAY IN THE EVENING  . omeprazole (PRILOSEC) 20 MG capsule omeprazole 20 mg capsule,delayed release  . predniSONE (STERAPRED UNI-PAK 48 TAB) 10 MG (48) TBPK tablet prednisone 10 mg tablets in a dose pack  Take 1 dose pk by oral route as directed.  . [DISCONTINUED] hydrOXYzine (ATARAX/VISTARIL) 25 MG tablet Take 1 tablet (25 mg total) by mouth 3 (three) times daily as needed for anxiety (sleep).  . [DISCONTINUED] LORazepam (ATIVAN) 0.5 MG tablet Take 1 tablet (0.5 mg total) by mouth every 8 (eight) hours. As needed for anxiety.  . [  DISCONTINUED] sertraline (ZOLOFT) 50 MG tablet Take 1 tablet (50 mg total) by mouth daily. For mood control  . [DISCONTINUED] traZODone (DESYREL) 50 MG tablet Take 1 tablet (50 mg total) by mouth at bedtime as needed for sleep.     Allergies:    Patient has no known allergies.   Social History: Social History   Socioeconomic History  . Marital status: Married    Spouse name: Not on file  . Number of children: Not on file  . Years of education: Not on file  . Highest education level: Not on file  Occupational History  . Occupation: [employer] doctor  Tobacco Use  . Smoking  status: Never Smoker  . Smokeless tobacco: Never Used  Substance and Sexual Activity  . Alcohol use: No  . Drug use: No  . Sexual activity: Not on file  Other Topics Concern  . Not on file  Social History Narrative  . Not on file   Social Determinants of Health   Financial Resource Strain: Not on file  Food Insecurity: Not on file  Transportation Needs: Not on file  Physical Activity: Not on file  Stress: Not on file  Social Connections: Not on file     Family History: The patient's family history includes Diabetes in his mother; Hypertension in his father and mother.  ROS:   All other ROS reviewed and negative. Pertinent positives noted in the HPI.     EKGs/Labs/Other Studies Reviewed:   The following studies were personally reviewed by me today:  EKG:  EKG is ordered today.  The ekg ordered today demonstrates normal sinus rhythm heart rate 83, no acute ischemic changes, no evidence of prior infarction, and was personally reviewed by me.   TTE 09/04/2016 - Left ventricle: The cavity size was normal. Wall thickness was  increased in a pattern of mild LVH. Systolic function was normal.  The estimated ejection fraction was in the range of 50% to 55%.  Wall motion was normal; there were no regional wall motion  abnormalities. Doppler parameters are consistent with abnormal  left ventricular relaxation (grade 1 diastolic dysfunction).  - Mitral valve: Calcified annulus.   NM Stress 08/29/2016  Low risk stress nuclear study with no ischemia or infarction; EF 45 but visually appears better; suggest echocardiogram to further assess.  Recent Labs: No results found for requested labs within last 8760 hours.   Recent Lipid Panel    Component Value Date/Time   CHOL  08/28/2010 0352    147        ATP III CLASSIFICATION:  <200     mg/dL   Desirable  482-707  mg/dL   Borderline High  >=867    mg/dL   High          TRIG 544 (H) 08/28/2010 0352   HDL 27 (L) 08/28/2010  0352   CHOLHDL 5.4 08/28/2010 0352   VLDL 31 08/28/2010 0352   LDLCALC  08/28/2010 0352    89        Total Cholesterol/HDL:CHD Risk Coronary Heart Disease Risk Table                     Men   Women  1/2 Average Risk   3.4   3.3  Average Risk       5.0   4.4  2 X Average Risk   9.6   7.1  3 X Average Risk  23.4   11.0  Use the calculated Patient Ratio above and the CHD Risk Table to determine the patient's CHD Risk.        ATP III CLASSIFICATION (LDL):  <100     mg/dL   Optimal  245-809  mg/dL   Near or Above                    Optimal  130-159  mg/dL   Borderline  983-382  mg/dL   High  >505     mg/dL   Very High    Physical Exam:   VS:  BP 118/81   Pulse 83   Ht 5\' 6"  (1.676 m)   Wt 203 lb 3.2 oz (92.2 kg)   SpO2 98%   BMI 32.80 kg/m    Wt Readings from Last 3 Encounters:  05/28/20 203 lb 3.2 oz (92.2 kg)  05/03/17 187 lb (84.8 kg)  04/30/17 188 lb 4.8 oz (85.4 kg)    General: Well nourished, well developed, in no acute distress Head: Atraumatic, normal size  Eyes: PEERLA, EOMI  Neck: Supple, no JVD Endocrine: No thryomegaly Cardiac: Normal S1, S2; RRR; no murmurs, rubs, or gallops Lungs: Clear to auscultation bilaterally, no wheezing, rhonchi or rales  Abd: Soft, nontender, no hepatomegaly  Ext: No edema, pulses 2+ Musculoskeletal: No deformities, BUE and BLE strength normal and equal Skin: Warm and dry, no rashes   Neuro: Alert and oriented to person, place, time, and situation, CNII-XII grossly intact, no focal deficits  Psych: Normal mood and affect   ASSESSMENT:   [name] is a 60 y.o. male who presents for the following: 1. Chest pain, unspecified type     PLAN:   1. Chest pain, unspecified type -He presents with pleuritic chest pain with taking in deep breaths after COVID-19 infection.  Symptoms are improving.  He describes no exertional angina.  He is short of breath but was prescribed an albuterol inhaler and things are improving.  EKG  is without ischemic changes and shows normal sinus rhythm.  His cardiovascular examination is normal.  He had a normal stress test in 2018.  Overall I feel his symptoms are strictly related to COVID-19 infection.  I think they are improving.  I have recommended an echocardiogram to ensure his heart is structurally normal and there are no changes concerning after his Covid infection.  We will see 2019 as needed.  I will notify him of the results by phone.  Disposition: Return if symptoms worsen or fail to improve.  Medication Adjustments/Labs and Tests Ordered: Current medicines are reviewed at length with the patient today.  Concerns regarding medicines are outlined above.  Orders Placed This Encounter  Procedures  . ECHOCARDIOGRAM COMPLETE   No orders of the defined types were placed in this encounter.   Patient Instructions  Medication Instructions:  The current medical regimen is effective;  continue present plan and medications.  *If you need a refill on your cardiac medications before your next appointment, please call your pharmacy*  Testing/Procedures: Echocardiogram - Your physician has requested that you have an echocardiogram. Echocardiography is a painless test that uses sound waves to create images of your heart. It provides your doctor with information about the size and shape of your heart and how well your heart's chambers and valves are working. This procedure takes approximately one hour. There are no restrictions for this procedure. This will be performed at our [facility] location - [address].  Follow-Up: At [facility], you and your health needs are our priority.  As part of our continuing mission to provide you with exceptional heart care, we have created designated Provider Care Teams.  These Care Teams include your primary Cardiologist (physician) and Advanced Practice Providers (APPs -  Physician Assistants and Nurse Practitioners) who all work  together to provide you with the care you need, when you need it.  We recommend signing up for the patient portal called "MyChart".  Sign up information is provided on this After Visit Summary.  MyChart is used to connect with patients for Virtual Visits (Telemedicine).  Patients are able to view lab/test results, encounter notes, upcoming appointments, etc.  Non-urgent messages can be sent to your provider as well.   To learn more about what you can do with MyChart, go to [email or web address].    Your next appointment:   12 month(s)  The format for your next appointment:   In Person  Provider:   Lennie Odor, MD     Signed, Lenna Gilford. Flora Lipps, MD [facility]  [address] [place], [place] [address] [phone]  05/28/2020 3:18 PM

## 2020-05-28 ENCOUNTER — Ambulatory Visit: Payer: BC Managed Care – PPO | Admitting: Cardiovascular Disease

## 2020-05-28 ENCOUNTER — Other Ambulatory Visit: Payer: Self-pay

## 2020-05-28 ENCOUNTER — Encounter: Payer: Self-pay | Admitting: Cardiovascular Disease

## 2020-05-28 VITALS — BP 118/81 | HR 83 | Ht 66.0 in | Wt 203.2 lb

## 2020-05-28 DIAGNOSIS — R079 Chest pain, unspecified: Secondary | ICD-10-CM

## 2020-05-28 NOTE — Patient Instructions (Addendum)
Medication Instructions:  The current medical regimen is effective;  continue present plan and medications.  *If you need a refill on your cardiac medications before your next appointment, please call your pharmacy*   Testing/Procedures: Echocardiogram - Your physician has requested that you have an echocardiogram. Echocardiography is a painless test that uses sound waves to create images of your heart. It provides your doctor with information about the size and shape of your heart and how well your heart's chambers and valves are working. This procedure takes approximately one hour. There are no restrictions for this procedure. This will be performed at our [address] location - [address], [address].    Follow-Up: At [facility], you and your health needs are our priority.  As part of our continuing mission to provide you with exceptional heart care, we have created designated Provider Care Teams.  These Care Teams include your primary Cardiologist (physician) and Advanced Practice Providers (APPs -  Physician Assistants and Nurse Practitioners) who all work together to provide you with the care you need, when you need it.  We recommend signing up for the patient portal called "MyChart".  Sign up information is provided on this After Visit Summary.  MyChart is used to connect with patients for Virtual Visits (Telemedicine).  Patients are able to view lab/test results, encounter notes, upcoming appointments, etc.  Non-urgent messages can be sent to your provider as well.   To learn more about what you can do with MyChart, go to [email or web address].    Your next appointment:   As needed  The format for your next appointment:   In Person  Provider:   Stonewall O'[name], MD      

## 2020-06-21 ENCOUNTER — Other Ambulatory Visit (HOSPITAL_COMMUNITY): Payer: BC Managed Care – PPO

## 2020-06-21 ENCOUNTER — Encounter (HOSPITAL_COMMUNITY): Payer: Self-pay | Admitting: Cardiovascular Disease

## 2020-07-05 ENCOUNTER — Telehealth (HOSPITAL_COMMUNITY): Payer: Self-pay | Admitting: Cardiovascular Disease

## 2020-07-05 NOTE — Telephone Encounter (Signed)
Just an FYI. We have made several attempts to contact this patient including sending a letter to schedule or reschedule their echocardiogram. We will be removing the patient from the echo WQ.  06/21/20 NO SHOWED-MAILED LETTER LBW      Thank you 

## 2020-09-06 ENCOUNTER — Other Ambulatory Visit: Payer: Self-pay | Admitting: Physical Medicine and Rehabilitation

## 2020-09-06 DIAGNOSIS — M542 Cervicalgia: Secondary | ICD-10-CM

## 2020-09-09 ENCOUNTER — Other Ambulatory Visit: Payer: Self-pay | Admitting: Physical Medicine and Rehabilitation

## 2020-09-09 DIAGNOSIS — M542 Cervicalgia: Secondary | ICD-10-CM

## 2020-09-20 ENCOUNTER — Ambulatory Visit
Admission: RE | Admit: 2020-09-20 | Discharge: 2020-09-20 | Disposition: A | Discharge status: Home / Self Care | Payer: BC Managed Care – PPO | Source: Ambulatory Visit | Attending: Physical Medicine and Rehabilitation | Admitting: Physical Medicine and Rehabilitation

## 2020-09-20 DIAGNOSIS — M542 Cervicalgia: Secondary | ICD-10-CM

## 2020-10-14 ENCOUNTER — Emergency Department (HOSPITAL_COMMUNITY)
Admission: EM | Admit: 2020-10-14 | Discharge: 2020-10-14 | Discharge status: Against Medical Advice | Payer: BC Managed Care – PPO | Attending: Emergency Medicine | Admitting: Emergency Medicine

## 2020-10-14 ENCOUNTER — Other Ambulatory Visit: Payer: Self-pay

## 2020-10-14 ENCOUNTER — Encounter (HOSPITAL_COMMUNITY): Payer: Self-pay | Admitting: Emergency Medicine

## 2020-10-14 ENCOUNTER — Emergency Department (HOSPITAL_COMMUNITY): Payer: BC Managed Care – PPO

## 2020-10-14 ENCOUNTER — Other Ambulatory Visit: Payer: Self-pay | Admitting: Chiropractic Medicine

## 2020-10-14 DIAGNOSIS — M5416 Radiculopathy, lumbar region: Secondary | ICD-10-CM

## 2020-10-14 DIAGNOSIS — R079 Chest pain, unspecified: Secondary | ICD-10-CM | POA: Insufficient documentation

## 2020-10-14 DIAGNOSIS — M5412 Radiculopathy, cervical region: Secondary | ICD-10-CM

## 2020-10-14 DIAGNOSIS — R5383 Other fatigue: Secondary | ICD-10-CM | POA: Diagnosis not present

## 2020-10-14 DIAGNOSIS — Z5321 Procedure and treatment not carried out due to patient leaving prior to being seen by health care provider: Secondary | ICD-10-CM | POA: Insufficient documentation

## 2020-10-14 DIAGNOSIS — R252 Cramp and spasm: Secondary | ICD-10-CM | POA: Insufficient documentation

## 2020-10-14 DIAGNOSIS — R112 Nausea with vomiting, unspecified: Secondary | ICD-10-CM | POA: Insufficient documentation

## 2020-10-14 LAB — CBC WITH DIFFERENTIAL/PLATELET
Abs Immature Granulocytes: 0.03 10*3/uL (ref 0.00–0.07)
Basophils Absolute: 0 10*3/uL (ref 0.0–0.1)
Basophils Relative: 0 %
Eosinophils Absolute: 0.2 10*3/uL (ref 0.0–0.5)
Eosinophils Relative: 2 %
HCT: 44.7 % (ref 39.0–52.0)
Hemoglobin: 15.4 g/dL (ref 13.0–17.0)
Immature Granulocytes: 0 %
Lymphocytes Relative: 6 %
Lymphs Abs: 0.6 10*3/uL — ABNORMAL LOW (ref 0.7–4.0)
MCH: 31.9 pg (ref 26.0–34.0)
MCHC: 34.5 g/dL (ref 30.0–36.0)
MCV: 92.5 fL (ref 80.0–100.0)
Monocytes Absolute: 1 10*3/uL (ref 0.1–1.0)
Monocytes Relative: 10 %
Neutro Abs: 8.6 10*3/uL — ABNORMAL HIGH (ref 1.7–7.7)
Neutrophils Relative %: 82 %
Platelets: 240 10*3/uL (ref 150–400)
RBC: 4.83 MIL/uL (ref 4.22–5.81)
RDW: 13.4 % (ref 11.5–15.5)
WBC: 10.6 10*3/uL — ABNORMAL HIGH (ref 4.0–10.5)
nRBC: 0 % (ref 0.0–0.2)

## 2020-10-14 LAB — TROPONIN I (HIGH SENSITIVITY)
Troponin I (High Sensitivity): 5 ng/L (ref <18)
Troponin I (High Sensitivity): 6 ng/L (ref <18)

## 2020-10-14 LAB — BASIC METABOLIC PANEL
Anion gap: 9 (ref 5–15)
BUN: 19 mg/dL (ref 6–20)
CO2: 22 mmol/L (ref 22–32)
Calcium: 8.1 mg/dL — ABNORMAL LOW (ref 8.9–10.3)
Chloride: 104 mmol/L (ref 98–111)
Creatinine, Ser: 1.15 mg/dL (ref 0.61–1.24)
GFR, Estimated: >60 mL/min (ref >60)
Glucose, Bld: 135 mg/dL — ABNORMAL HIGH (ref 70–99)
Potassium: 3.6 mmol/L (ref 3.5–5.1)
Sodium: 135 mmol/L (ref 135–145)

## 2020-10-14 LAB — CK: Total CK: 378 U/L (ref 49–397)

## 2020-10-14 NOTE — ED Triage Notes (Signed)
Pt BIB [facility], pt works outside c/o feeling hot, nauseous and fatigued earlier in the day, woke up at 11pm tonight with sudden onset chest pain. Given 324mg  asa and 2NTG by EMS, no relief with NTG.

## 2020-10-14 NOTE — ED Notes (Signed)
Pt stated he was leaving AMA, IV removed

## 2020-10-14 NOTE — ED Provider Notes (Signed)
Emergency Medicine Provider Triage Evaluation Note  [name] , a 60 y.o. male  was evaluated in triage.  Pt complains of chest pain, nausea, vomiting, muscle cramps.  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  Reports he was told he had a "mini heart attack" by his doctor but had follow-up stress test that was normal.  Had SL NTG and ASA with EMS along with 500cc bolus.  Review of Systems  Positive: Chest pain, nausea, vomiting, cramping Negative: Fever, chills, cough  Physical Exam  BP 131/87 (BP Location: Left Arm)   Pulse (!) 113   Temp 98.2 F (36.8 C) (Oral)   Resp 18   SpO2 98%   Gen:    Awake, no distress   Resp:  Normal effort  MSK:   Moves extremities without difficulty   Medical Decision Making  Medically screening exam initiated at 12:57 AM.  Appropriate orders placed.  [name] was informed that the remainder of the evaluation will be completed by another provider, this initial triage assessment does not replace that evaluation, and the importance of remaining in the ED until their evaluation is complete.     Bradley Hatchet, PA-C 10/14/20 0101    Tegeler, Bradley Brim, MD 10/14/20 [phone]

## 2020-10-25 ENCOUNTER — Inpatient Hospital Stay: Admission: RE | Admit: 2020-10-25 | Payer: BC Managed Care – PPO | Source: Ambulatory Visit

## 2020-10-25 ENCOUNTER — Other Ambulatory Visit: Payer: BC Managed Care – PPO

## 2020-10-29 ENCOUNTER — Other Ambulatory Visit: Payer: BC Managed Care – PPO

## 2020-12-21 ENCOUNTER — Other Ambulatory Visit: Payer: Self-pay

## 2020-12-21 ENCOUNTER — Ambulatory Visit
Admission: RE | Admit: 2020-12-21 | Discharge: 2020-12-21 | Disposition: A | Discharge status: Home / Self Care | Payer: BC Managed Care – PPO | Source: Ambulatory Visit | Attending: Chiropractic Medicine | Admitting: Chiropractic Medicine

## 2020-12-21 DIAGNOSIS — M5416 Radiculopathy, lumbar region: Secondary | ICD-10-CM

## 2020-12-21 MED ORDER — IOPAMIDOL (ISOVUE-M 200) INJECTION 41%
1.0000 mL | Freq: Once | INTRAMUSCULAR | Status: AC
  Administered 2020-12-21: 1 mL via EPIDURAL

## 2020-12-21 MED ORDER — METHYLPREDNISOLONE ACETATE 40 MG/ML INJ SUSP (RADIOLOG
80.0000 mg | Freq: Once | INTRAMUSCULAR | Status: AC
  Administered 2020-12-21: 80 mg via EPIDURAL

## 2020-12-21 NOTE — Discharge Instructions (Addendum)

## 2022-12-11 ENCOUNTER — Other Ambulatory Visit: Payer: Self-pay | Admitting: Orthopedic Surgery

## 2022-12-11 DIAGNOSIS — M545 Low back pain, unspecified: Secondary | ICD-10-CM

## 2022-12-14 ENCOUNTER — Telehealth: Payer: Self-pay

## 2022-12-14 NOTE — Telephone Encounter (Signed)
Phone call to patient to discuss order for intradiscal injection. Spoke to pt and his spouse. Reviewed pts allergies and most recent weight. Pt/cg was advised not to take oral antibiotics for this procedure that we would give him IV antibiotics prior. Discharge instructions also reviewed with patient/cg and instructed patient to have a driver the day of the procedure. Pt verbalized understanding.

## 2022-12-21 NOTE — Discharge Instructions (Addendum)
Intra-Discal Anesthetic Injection Discharge Instruction Sheet  You may resume a regular diet and any medications that you routinely take (including pain medications).  No driving day of procedure.  Light activity throughout the rest of the day.  Do not do any strenuous work, exercise, bending or lifting.  The day following the procedure, you can resume normal physical activity but you should refrain from exercising or physical therapy for at least three days thereafter.  4.  The incidence of headache, nausea, or vomiting is about 5% (one in 20 patients).  If you develop a headache, lie flat for 24 hours and drink plenty of fluids until the headache goes away.    Caffeinated beverages may be helpful. If when you get up you still have a headache when standing, go back to bed and force fluids for another 24 hours.   Please contact our office at [phone] for the following symptoms: Fever greater than 100 degrees. Headaches unresolved with medication after 2-3 days.  YOU MAY RESUME YOUR ASPIRIN ANYTIME AFTER PROCEDURE TODAY

## 2022-12-22 ENCOUNTER — Ambulatory Visit
Admission: RE | Admit: 2022-12-22 | Discharge: 2022-12-22 | Disposition: A | Discharge status: Home / Self Care | Payer: No Typology Code available for payment source | Source: Ambulatory Visit | Attending: Orthopedic Surgery | Admitting: Orthopedic Surgery

## 2022-12-22 ENCOUNTER — Other Ambulatory Visit: Payer: Self-pay | Admitting: Orthopedic Surgery

## 2022-12-22 DIAGNOSIS — M545 Low back pain, unspecified: Secondary | ICD-10-CM

## 2022-12-22 MED ORDER — ONDANSETRON HCL 4 MG/2ML IJ SOLN
4.0000 mg | Freq: Once | INTRAMUSCULAR | Status: AC
  Administered 2022-12-22: 4 mg via INTRAMUSCULAR

## 2022-12-22 MED ORDER — METHYLPREDNISOLONE ACETATE 40 MG/ML INJ SUSP (RADIOLOG
80.0000 mg | Freq: Once | INTRAMUSCULAR | Status: AC
  Administered 2022-12-22: 80 mg via INTRALESIONAL

## 2022-12-22 MED ORDER — CEFAZOLIN SODIUM-DEXTROSE 2-4 GM/100ML-% IV SOLN
2.0000 g | INTRAVENOUS | Status: AC
  Administered 2022-12-22: 2 g via INTRAVENOUS

## 2022-12-22 MED ORDER — IOPAMIDOL (ISOVUE-M 200) INJECTION 41%
1.0000 mL | Freq: Once | INTRAMUSCULAR | Status: AC
  Administered 2022-12-22: 1 mL

## 2022-12-22 MED ORDER — MEPERIDINE HCL 50 MG/ML IJ SOLN
50.0000 mg | Freq: Once | INTRAMUSCULAR | Status: AC
  Administered 2022-12-22: 50 mg via INTRAMUSCULAR

## 2022-12-22 NOTE — Discharge Instr - Other Info (Signed)
1045: pt reports pain 8/10 after intradiscal procedure. See MAR 1105: pt reports pain 0/10. Resting in nursing recovery area.

## 2023-06-29 IMAGING — XA Imaging study
2 series · 2 of 2 positions shown · non-contrast
Comparison: none

CLINICAL DATA: Spondylosis without myelopathy. Lumbar
radiculopathy. Back and bilateral leg pain.

[Series 1: ortho standard · 1 of 1 slices shown (1 of 2)]
[im 1/1]
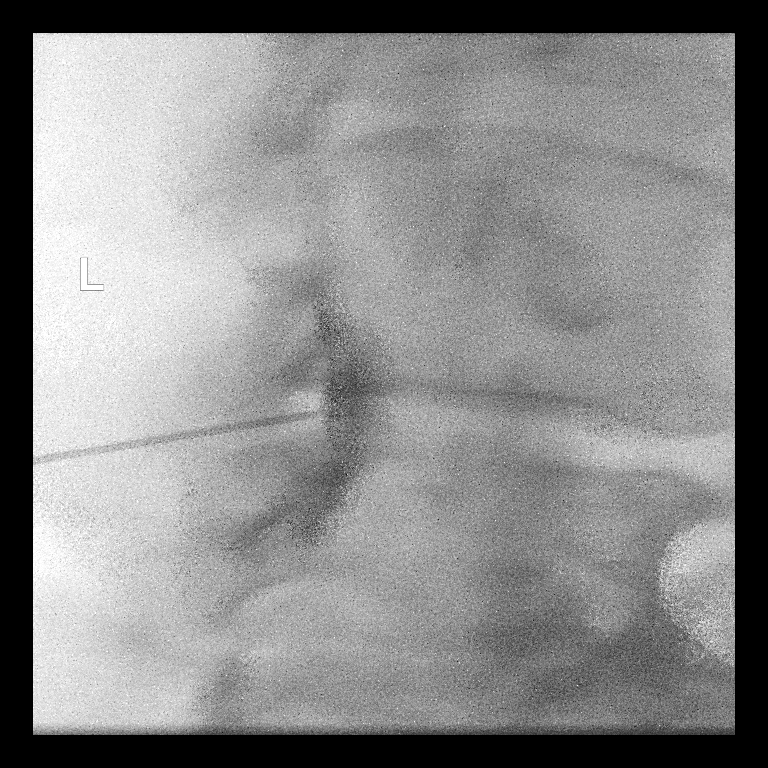

[Series 2: ortho standard · 1 of 1 slices shown (2 of 2)]
[im 1/1]
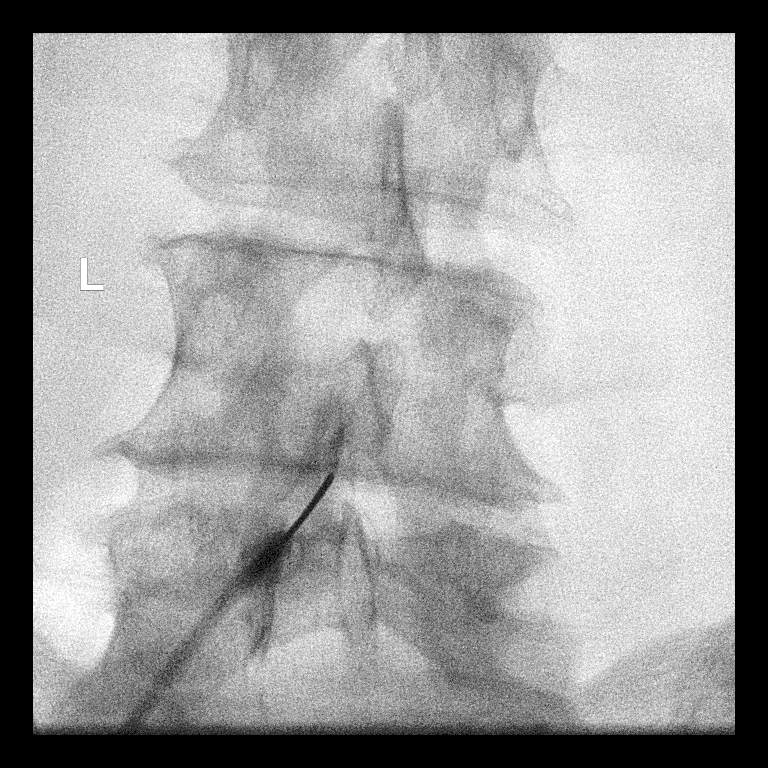

[2 of 2 positions shown; findings below may reference images not displayed]

FLUOROSCOPY TIME:  0 minutes 23 seconds. 27.07 micro gray meter
squared

PROCEDURE:
The procedure, risks, benefits, and alternatives were explained to
the patient. Questions regarding the procedure were encouraged and
answered. The patient understands and consents to the procedure.

LUMBAR EPIDURAL INJECTION:

An interlaminar approach was performed on the left at L4-5. The
overlying skin was cleansed and anesthetized. A 20 gauge epidural
needle was advanced using loss-of-resistance technique.

DIAGNOSTIC EPIDURAL INJECTION:

Injection of Isovue-M 200 shows a good epidural pattern with spread
above and below the level of needle placement, some majority on the
left, but to both sides. No vascular opacification is seen.

THERAPEUTIC EPIDURAL INJECTION:

Eighty mg of Depo-Medrol mixed with 2.5 cc 1% lidocaine were
instilled. The procedure was well-tolerated, and the patient was
discharged thirty minutes following the injection in good condition.

COMPLICATIONS:
None
IMPRESSION: Technically successful epidural injection on the left near midline
at L4-5 # 1.

## 2023-07-24 ENCOUNTER — Ambulatory Visit (INDEPENDENT_AMBULATORY_CARE_PROVIDER_SITE_OTHER): Admitting: Vascular Surgery

## 2023-07-24 ENCOUNTER — Ambulatory Visit (HOSPITAL_COMMUNITY): Payer: Self-pay | Admitting: Orthopedic Surgery

## 2023-07-24 ENCOUNTER — Encounter: Admitting: Vascular Surgery

## 2023-07-24 ENCOUNTER — Encounter: Payer: Self-pay | Admitting: Vascular Surgery

## 2023-07-24 VITALS — BP 144/101 | HR 80 | Temp 98.1°F | Resp 20 | Ht 66.0 in | Wt 209.1 lb

## 2023-07-24 DIAGNOSIS — M545 Low back pain, unspecified: Secondary | ICD-10-CM

## 2023-07-24 DIAGNOSIS — G8929 Other chronic pain: Secondary | ICD-10-CM | POA: Diagnosis not present
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
## 2023-07-27 NOTE — Progress Notes (Signed)
 RN left message with Cordelia Pen regarding surgical consents.

## 2023-07-30 ENCOUNTER — Encounter (HOSPITAL_COMMUNITY): Payer: Self-pay

## 2023-07-30 ENCOUNTER — Encounter (HOSPITAL_COMMUNITY)
Admission: RE | Admit: 2023-07-30 | Discharge: 2023-07-30 | Disposition: A | Discharge status: Home / Self Care | Payer: Self-pay | Source: Ambulatory Visit | Attending: Orthopedic Surgery | Admitting: Orthopedic Surgery

## 2023-07-30 ENCOUNTER — Other Ambulatory Visit: Payer: Self-pay

## 2023-07-30 ENCOUNTER — Ambulatory Visit (HOSPITAL_COMMUNITY): Payer: Self-pay | Admitting: Physician Assistant

## 2023-07-30 VITALS — BP 136/95 | HR 69 | Temp 98.0°F | Resp 18 | Ht 66.0 in | Wt 207.5 lb

## 2023-07-30 DIAGNOSIS — K219 Gastro-esophageal reflux disease without esophagitis: Secondary | ICD-10-CM | POA: Diagnosis not present

## 2023-07-30 DIAGNOSIS — Z01818 Encounter for other preprocedural examination: Secondary | ICD-10-CM | POA: Diagnosis not present

## 2023-07-30 DIAGNOSIS — F32A Depression, unspecified: Secondary | ICD-10-CM | POA: Insufficient documentation

## 2023-07-30 DIAGNOSIS — M51362 Other intervertebral disc degeneration, lumbar region with discogenic back pain and lower extremity pain: Secondary | ICD-10-CM | POA: Insufficient documentation

## 2023-07-30 DIAGNOSIS — R9431 Abnormal electrocardiogram [ECG] [EKG]: Secondary | ICD-10-CM | POA: Diagnosis not present

## 2023-07-30 DIAGNOSIS — E785 Hyperlipidemia, unspecified: Secondary | ICD-10-CM | POA: Insufficient documentation

## 2023-07-30 DIAGNOSIS — I10 Essential (primary) hypertension: Secondary | ICD-10-CM | POA: Diagnosis not present

## 2023-07-30 DIAGNOSIS — Z9889 Other specified postprocedural states: Secondary | ICD-10-CM | POA: Insufficient documentation

## 2023-07-30 DIAGNOSIS — Z01812 Encounter for preprocedural laboratory examination: Secondary | ICD-10-CM | POA: Diagnosis present

## 2023-07-30 DIAGNOSIS — Z01810 Encounter for preprocedural cardiovascular examination: Secondary | ICD-10-CM | POA: Diagnosis present

## 2023-07-30 HISTORY — DX: Other specified postprocedural states: Z98.890

## 2023-07-30 LAB — TYPE AND SCREEN
ABO/RH(D): A POS
Antibody Screen: NEGATIVE

## 2023-07-30 LAB — CBC
HCT: 47.6 % (ref 39.0–52.0)
Hemoglobin: 16.3 g/dL (ref 13.0–17.0)
MCH: 31.3 pg (ref 26.0–34.0)
MCHC: 34.2 g/dL (ref 30.0–36.0)
MCV: 91.4 fL (ref 80.0–100.0)
Platelets: 286 10*3/uL (ref 150–400)
RBC: 5.21 MIL/uL (ref 4.22–5.81)
RDW: 13.3 % (ref 11.5–15.5)
WBC: 8 10*3/uL (ref 4.0–10.5)
nRBC: 0 % (ref 0.0–0.2)

## 2023-07-30 LAB — BASIC METABOLIC PANEL WITH GFR
Anion gap: 9 (ref 5–15)
BUN: 22 mg/dL (ref 8–23)
CO2: 28 mmol/L (ref 22–32)
Calcium: 9.6 mg/dL (ref 8.9–10.3)
Chloride: 101 mmol/L (ref 98–111)
Creatinine, Ser: 1.3 mg/dL — ABNORMAL HIGH (ref 0.61–1.24)
GFR, Estimated: >60 mL/min (ref >60)
Glucose, Bld: 109 mg/dL — ABNORMAL HIGH (ref 70–99)
Potassium: 4.1 mmol/L (ref 3.5–5.1)
Sodium: 138 mmol/L (ref 135–145)

## 2023-07-30 LAB — SURGICAL PCR SCREEN
MRSA, PCR: NEGATIVE
Staphylococcus aureus: NEGATIVE

## 2023-07-30 NOTE — Progress Notes (Signed)
 PCP - does not currently have one, goes to urgent care if needs to. Cardiologist - Dr. Gerri Spore O'[name]  Patient states lost insurance a year ago and will get new physician's  PPM/ICD - Denies Device Orders - n/a Rep Notified - n/a  Chest x-ray - N/A EKG - 07-30-23 Stress Test - 08-29-16 ECHO - 09-04-16 Cardiac Cath - Denies  Sleep Study - Denies CPAP - N/A  NON-diabetic  Last dose of GLP1 agonist-  Denies GLP1 instructions: n/a  Blood Thinner Instructions: Denies Aspirin Instructions: Denies  ERAS Protcol - Clears until 0430 PRE-SURGERY Ensure or G2- none  COVID TEST- No   Anesthesia review: Yes, HTN w/new EKG  Patient denies shortness of breath, fever, cough and chest pain at PAT appointment. Patient denies any respiratory issues at this time.    All instructions explained to the patient, with a verbal understanding of the material. Patient agrees to go over the instructions while at home for a better understanding. Patient also instructed to self quarantine after being tested for COVID-19. The opportunity to ask questions was provided.

## 2023-07-30 NOTE — Progress Notes (Signed)
 Surgical Instructions   Your procedure is scheduled on Monday, April 14, 25. Report to [facility] Main Entrance "A" at 5:30 A.M., then check in with the Admitting office. Any questions or running late day of surgery: call [phone]  Questions prior to your surgery date: call [phone], Monday-Friday, 8am-4pm. If you experience any cold or flu symptoms such as cough, fever, chills, shortness of breath, etc. between now and your scheduled surgery, please notify us at the above number.     Remember:  Do not eat after midnight the night before your surgery  You may drink clear liquids until 4:30 the morning of your surgery.   Clear liquids allowed are: Water, Non-Citrus Juices (without pulp), Carbonated Beverages, Clear Tea (no milk, honey, etc.), Black Coffee Only (NO MILK, CREAM OR POWDERED CREAMER of any kind), and Gatorade.    Take these medicines the morning of surgery with A SIP OF WATER  omeprazole (PRILOSEC)   One week prior to surgery, STOP taking any Aspirin (unless otherwise instructed by your surgeon) Aleve, Naproxen, Ibuprofen, Motrin, Advil, Goody's, BC's, all herbal medications, fish oil, and non-prescription vitamins.                     Do NOT Smoke (Tobacco/Vaping) for 24 hours prior to your procedure.  If you use a CPAP at night, you may bring your mask/headgear for your overnight stay.   You will be asked to remove any contacts, glasses, piercing's, hearing aid's, dentures/partials prior to surgery. Please bring cases for these items if needed.    Patients discharged the day of surgery will not be allowed to drive home, and someone needs to stay with them for 24 hours.  SURGICAL WAITING ROOM VISITATION Patients may have no more than 2 support people in the waiting area - these visitors may rotate.   Pre-op nurse will coordinate an appropriate time for 1 ADULT support person, who may not rotate, to accompany patient in pre-op.  Children under the age of 38 must  have an adult with them who is not the patient and must remain in the main waiting area with an adult.  If the patient needs to stay at the hospital during part of their recovery, the visitor guidelines for inpatient rooms apply.  Please refer to the [facility] website for the visitor guidelines for any additional information.   If you received a COVID test during your pre-op visit  it is requested that you wear a mask when out in public, stay away from anyone that may not be feeling well and notify your surgeon if you develop symptoms. If you have been in contact with anyone that has tested positive in the last 10 days please notify you surgeon.      Pre-operative 5 CHG Bathing Instructions   You can play a key role in reducing the risk of infection after surgery. Your skin needs to be as free of germs as possible. You can reduce the number of germs on your skin by washing with CHG (chlorhexidine gluconate) soap before surgery. CHG is an antiseptic soap that kills germs and continues to kill germs even after washing.   DO NOT use if you have an allergy to chlorhexidine/CHG or antibacterial soaps. If your skin becomes reddened or irritated, stop using the CHG and notify one of our RNs at [phone].   Please shower with the CHG soap starting 4 days before surgery using the following schedule:     Please keep  in mind the following:  DO NOT shave, including legs and underarms, starting the day of your first shower.   You may shave your face at any point before/day of surgery.  Place clean sheets on your bed the day you start using CHG soap. Use a clean washcloth (not used since being washed) for each shower. DO NOT sleep with pets once you start using the CHG.   CHG Shower Instructions:  Wash your face and private area with normal soap. If you choose to wash your hair, wash first with your normal shampoo.  After you use shampoo/soap, rinse your hair and body thoroughly to remove  shampoo/soap residue.  Turn the water OFF and apply about 3 tablespoons (45 ml) of CHG soap to a CLEAN washcloth.  Apply CHG soap ONLY FROM YOUR NECK DOWN TO YOUR TOES (washing for 3-5 minutes)  DO NOT use CHG soap on face, private areas, open wounds, or sores.  Pay special attention to the area where your surgery is being performed.  If you are having back surgery, having someone wash your back for you may be helpful. Wait 2 minutes after CHG soap is applied, then you may rinse off the CHG soap.  Pat dry with a clean towel  Put on clean clothes/pajamas   If you choose to wear lotion, please use ONLY the CHG-compatible lotions that are listed below.  Additional instructions for the day of surgery: DO NOT APPLY any lotions, deodorants, cologne, or perfumes.   Do not bring valuables to the hospital. [facility] is not responsible for any belongings/valuables. Do not wear nail polish, gel polish, artificial nails, or any other type of covering on natural nails (fingers and toes) Do not wear jewelry or makeup Put on clean/comfortable clothes.  Please brush your teeth.  Ask your nurse before applying any prescription medications to the skin.     CHG Compatible Lotions   Aveeno Moisturizing lotion  Cetaphil Moisturizing Cream  Cetaphil Moisturizing Lotion  Clairol Herbal Essence Moisturizing Lotion, Dry Skin  Clairol Herbal Essence Moisturizing Lotion, Extra Dry Skin  Clairol Herbal Essence Moisturizing Lotion, Normal Skin  Curel Age Defying Therapeutic Moisturizing Lotion with Alpha Hydroxy  Curel Extreme Care Body Lotion  Curel Soothing Hands Moisturizing Hand Lotion  Curel Therapeutic Moisturizing Cream, Fragrance-Free  Curel Therapeutic Moisturizing Lotion, Fragrance-Free  Curel Therapeutic Moisturizing Lotion, Original Formula  Eucerin Daily Replenishing Lotion  Eucerin Dry Skin Therapy Plus Alpha Hydroxy Crme  Eucerin Dry Skin Therapy Plus Alpha Hydroxy Lotion  Eucerin  Original Crme  Eucerin Original Lotion  Eucerin Plus Crme Eucerin Plus Lotion  Eucerin TriLipid Replenishing Lotion  Keri Anti-Bacterial Hand Lotion  Keri Deep Conditioning Original Lotion Dry Skin Formula Softly Scented  Keri Deep Conditioning Original Lotion, Fragrance Free Sensitive Skin Formula  Keri Lotion Fast Absorbing Fragrance Free Sensitive Skin Formula  Keri Lotion Fast Absorbing Softly Scented Dry Skin Formula  Keri Original Lotion  Keri Skin Renewal Lotion Keri Silky Smooth Lotion  Keri Silky Smooth Sensitive Skin Lotion  Nivea Body Creamy Conditioning Oil  Nivea Body Extra Enriched Lotion  Nivea Body Original Lotion  Nivea Body Sheer Moisturizing Lotion Nivea Crme  Nivea Skin Firming Lotion  NutraDerm 30 Skin Lotion  NutraDerm Skin Lotion  NutraDerm Therapeutic Skin Cream  NutraDerm Therapeutic Skin Lotion  ProShield Protective Hand Cream  Provon moisturizing lotion  Please read over the following fact sheets that you were given.

## 2023-07-31 ENCOUNTER — Encounter (HOSPITAL_COMMUNITY): Payer: Self-pay
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
## 2023-08-02 ENCOUNTER — Other Ambulatory Visit: Payer: Self-pay

## 2023-08-10 NOTE — Anesthesia Preprocedure Evaluation (Addendum)
 Anesthesia Evaluation  Patient identified by MRN, date of birth, ID band Patient awake    Reviewed: Allergy & Precautions, NPO status , Patient's Chart, lab work & pertinent test results  History of Anesthesia Complications (+) PONV and history of anesthetic complications  Airway Mallampati: IV  TM Distance: >3 FB Neck ROM: Full    Dental  (+) Dental Advisory Given, Teeth Intact,    Pulmonary neg pulmonary ROS   breath sounds clear to auscultation       Cardiovascular hypertension, Pt. on medications (-) angina (-) Past MI and (-) CHF (-) dysrhythmias  Rhythm:Regular     Neuro/Psych  PSYCHIATRIC DISORDERS  Depression    negative neurological ROS     GI/Hepatic Neg liver ROS,GERD  Medicated and Controlled,,  Endo/Other  negative endocrine ROS    Renal/GU Renal InsufficiencyRenal diseaseLab Results      Component                Value               Date                      NA                       138                 07/30/2023                K                        4.1                 07/30/2023                CO2                      28                  07/30/2023                GLUCOSE                  109 (H)             07/30/2023                BUN                      22                  07/30/2023                CREATININE               1.30 (H)            07/30/2023                CALCIUM                   9.6                 07/30/2023                GFRNONAA                 >60  07/30/2023                Musculoskeletal negative musculoskeletal ROS (+)    Abdominal   Peds  Hematology negative hematology ROS (+) Lab Results      Component                Value               Date                      WBC                      8.0                 07/30/2023                HGB                      16.3                07/30/2023                HCT                      47.6                 07/30/2023                MCV                      91.4                07/30/2023                PLT                      286                 07/30/2023              Anesthesia Other Findings   Reproductive/Obstetrics                             Anesthesia Physical Anesthesia Plan  ASA: 3  Anesthesia Plan: General and Regional   Post-op Pain Management: Ofirmev  IV (intra-op)* and Regional block*   Induction: Intravenous  PONV Risk Score and Plan: 4 or greater and Ondansetron  and Dexamethasone   Airway Management Planned: Oral ETT and Video Laryngoscope Planned  Additional Equipment: None  Intra-op Plan:   Post-operative Plan: Extubation in OR  Informed Consent: I have reviewed the patients History and Physical, chart, labs and discussed the procedure including the risks, benefits and alternatives for the proposed anesthesia with the patient or authorized representative who has indicated his/her understanding and acceptance.     Dental advisory given  Plan Discussed with: CRNA  Anesthesia Plan Comments: (PAT note written 08/10/2023 by Allison Zelenak, PA-C.  )       Anesthesia Quick Evaluation

## 2023-08-13 ENCOUNTER — Inpatient Hospital Stay (HOSPITAL_COMMUNITY): Payer: Self-pay | Admitting: Vascular Surgery

## 2023-08-13 ENCOUNTER — Encounter (HOSPITAL_COMMUNITY): Payer: Self-pay | Admitting: Orthopedic Surgery

## 2023-08-13 ENCOUNTER — Inpatient Hospital Stay (HOSPITAL_COMMUNITY)
Admission: RE | Admit: 2023-08-13 | Discharge: 2023-08-23 | DRG: 428 | Disposition: A | Discharge status: Home / Self Care | Payer: Self-pay | Attending: Orthopedic Surgery | Admitting: Orthopedic Surgery

## 2023-08-13 ENCOUNTER — Inpatient Hospital Stay (HOSPITAL_COMMUNITY)

## 2023-08-13 ENCOUNTER — Encounter (HOSPITAL_COMMUNITY)
Admission: RE | Disposition: A | Discharge status: Home / Self Care | Payer: Self-pay | Source: Home / Self Care | Attending: Orthopedic Surgery

## 2023-08-13 ENCOUNTER — Other Ambulatory Visit: Payer: Self-pay

## 2023-08-13 DIAGNOSIS — E785 Hyperlipidemia, unspecified: Secondary | ICD-10-CM | POA: Diagnosis present

## 2023-08-13 DIAGNOSIS — M2578 Osteophyte, vertebrae: Secondary | ICD-10-CM | POA: Diagnosis present

## 2023-08-13 DIAGNOSIS — G8929 Other chronic pain: Secondary | ICD-10-CM | POA: Diagnosis present

## 2023-08-13 DIAGNOSIS — M5116 Intervertebral disc disorders with radiculopathy, lumbar region: Secondary | ICD-10-CM | POA: Diagnosis present

## 2023-08-13 DIAGNOSIS — M51362 Other intervertebral disc degeneration, lumbar region with discogenic back pain and lower extremity pain: Secondary | ICD-10-CM | POA: Diagnosis present

## 2023-08-13 DIAGNOSIS — Z751 Person awaiting admission to adequate facility elsewhere: Secondary | ICD-10-CM | POA: Diagnosis not present

## 2023-08-13 DIAGNOSIS — Z8249 Family history of ischemic heart disease and other diseases of the circulatory system: Secondary | ICD-10-CM

## 2023-08-13 DIAGNOSIS — Z833 Family history of diabetes mellitus: Secondary | ICD-10-CM | POA: Diagnosis not present

## 2023-08-13 DIAGNOSIS — K5900 Constipation, unspecified: Secondary | ICD-10-CM | POA: Diagnosis not present

## 2023-08-13 DIAGNOSIS — M5417 Radiculopathy, lumbosacral region: Secondary | ICD-10-CM

## 2023-08-13 DIAGNOSIS — R109 Unspecified abdominal pain: Secondary | ICD-10-CM | POA: Diagnosis not present

## 2023-08-13 DIAGNOSIS — Z888 Allergy status to other drugs, medicaments and biological substances status: Secondary | ICD-10-CM | POA: Diagnosis not present

## 2023-08-13 DIAGNOSIS — M7989 Other specified soft tissue disorders: Secondary | ICD-10-CM | POA: Diagnosis not present

## 2023-08-13 DIAGNOSIS — K219 Gastro-esophageal reflux disease without esophagitis: Secondary | ICD-10-CM | POA: Diagnosis present

## 2023-08-13 DIAGNOSIS — R42 Dizziness and giddiness: Secondary | ICD-10-CM | POA: Diagnosis not present

## 2023-08-13 DIAGNOSIS — N5089 Other specified disorders of the male genital organs: Secondary | ICD-10-CM | POA: Diagnosis not present

## 2023-08-13 DIAGNOSIS — I10 Essential (primary) hypertension: Secondary | ICD-10-CM | POA: Diagnosis present

## 2023-08-13 DIAGNOSIS — G8918 Other acute postprocedural pain: Secondary | ICD-10-CM | POA: Diagnosis not present

## 2023-08-13 DIAGNOSIS — F32A Depression, unspecified: Secondary | ICD-10-CM | POA: Diagnosis not present

## 2023-08-13 DIAGNOSIS — H5789 Other specified disorders of eye and adnexa: Secondary | ICD-10-CM | POA: Diagnosis not present

## 2023-08-13 DIAGNOSIS — Y990 Civilian activity done for income or pay: Secondary | ICD-10-CM | POA: Diagnosis not present

## 2023-08-13 DIAGNOSIS — M5136 Other intervertebral disc degeneration, lumbar region with discogenic back pain only: Secondary | ICD-10-CM | POA: Diagnosis not present

## 2023-08-13 DIAGNOSIS — Z981 Arthrodesis status: Principal | ICD-10-CM

## 2023-08-13 HISTORY — PX: ABDOMINAL EXPOSURE: SHX5708

## 2023-08-13 HISTORY — PX: ANTERIOR LUMBAR FUSION: SHX1170

## 2023-08-13 SURGERY — ANTERIOR LUMBAR FUSION 2 LEVELS
Anesthesia: Regional

## 2023-08-13 MED ORDER — THROMBIN 20000 UNITS EX KIT
PACK | CUTANEOUS | Status: DC | PRN
  Administered 2023-08-13: 20000 [IU] via TOPICAL

## 2023-08-13 MED ORDER — HYDROCHLOROTHIAZIDE 25 MG PO TABS
25.0000 mg | ORAL_TABLET | Freq: Every day | ORAL | Status: DC
  Administered 2023-08-15 – 2023-08-23 (×9): 25 mg via ORAL
  Filled 2023-08-13 (×9): qty 1

## 2023-08-13 MED ORDER — TRANEXAMIC ACID-NACL 1000-0.7 MG/100ML-% IV SOLN
1000.0000 mg | INTRAVENOUS | Status: AC
  Administered 2023-08-13: 1000 mg via INTRAVENOUS
  Filled 2023-08-13: qty 100

## 2023-08-13 MED ORDER — CEFAZOLIN SODIUM-DEXTROSE 1-4 GM/50ML-% IV SOLN
1.0000 g | Freq: Three times a day (TID) | INTRAVENOUS | Status: AC
  Administered 2023-08-13 – 2023-08-14 (×2): 1 g via INTRAVENOUS
  Filled 2023-08-13 (×2): qty 50

## 2023-08-13 MED ORDER — ATROPINE SULFATE 0.4 MG/ML IV SOLN
INTRAVENOUS | Status: AC
  Filled 2023-08-13: qty 1

## 2023-08-13 MED ORDER — SODIUM CHLORIDE 0.9 % IV SOLN: 250.0000 mL | INTRAVENOUS | Status: DC

## 2023-08-13 MED ORDER — LACTATED RINGERS IV SOLN: INTRAVENOUS | Status: DC

## 2023-08-13 MED ORDER — CHLORHEXIDINE GLUCONATE 0.12 % MT SOLN
15.0000 mL | Freq: Once | OROMUCOSAL | Status: AC
  Administered 2023-08-13: 15 mL via OROMUCOSAL
  Filled 2023-08-13: qty 15

## 2023-08-13 MED ORDER — DEXAMETHASONE SODIUM PHOSPHATE 10 MG/ML IJ SOLN
INTRAMUSCULAR | Status: AC
  Filled 2023-08-13: qty 1

## 2023-08-13 MED ORDER — OXYCODONE HCL 5 MG/5ML PO SOLN: 5.0000 mg | Freq: Once | ORAL | Status: DC | PRN

## 2023-08-13 MED ORDER — ACETAMINOPHEN 10 MG/ML IV SOLN
1000.0000 mg | Freq: Once | INTRAVENOUS | Status: DC | PRN
  Administered 2023-08-13: 1000 mg via INTRAVENOUS

## 2023-08-13 MED ORDER — MIDAZOLAM HCL 2 MG/2ML IJ SOLN
INTRAMUSCULAR | Status: DC | PRN
  Administered 2023-08-13: 2 mg via INTRAVENOUS

## 2023-08-13 MED ORDER — CEFAZOLIN SODIUM-DEXTROSE 2-4 GM/100ML-% IV SOLN
2.0000 g | INTRAVENOUS | Status: AC
  Administered 2023-08-13 (×2): 2 g via INTRAVENOUS
  Filled 2023-08-13: qty 100

## 2023-08-13 MED ORDER — DOCUSATE SODIUM 100 MG PO CAPS
100.0000 mg | ORAL_CAPSULE | Freq: Two times a day (BID) | ORAL | Status: DC
  Administered 2023-08-13: 100 mg via ORAL
  Filled 2023-08-13: qty 1

## 2023-08-13 MED ORDER — ALBUMIN HUMAN 5 % IV SOLN: INTRAVENOUS | Status: DC | PRN

## 2023-08-13 MED ORDER — PHENYLEPHRINE HCL-NACL 20-0.9 MG/250ML-% IV SOLN
INTRAVENOUS | Status: DC | PRN
  Administered 2023-08-13: 15 ug/min via INTRAVENOUS

## 2023-08-13 MED ORDER — 0.9 % SODIUM CHLORIDE (POUR BTL) OPTIME
TOPICAL | Status: DC | PRN
  Administered 2023-08-13 (×3): 1000 mL

## 2023-08-13 MED ORDER — CHLORHEXIDINE GLUCONATE CLOTH 2 % EX PADS: 6.0000 | MEDICATED_PAD | Freq: Once | CUTANEOUS | Status: DC

## 2023-08-13 MED ORDER — MENTHOL 3 MG MT LOZG: 1.0000 | LOZENGE | OROMUCOSAL | Status: DC | PRN

## 2023-08-13 MED ORDER — OXYCODONE HCL 5 MG PO TABS: 5.0000 mg | ORAL_TABLET | ORAL | Status: DC | PRN

## 2023-08-13 MED ORDER — LOSARTAN POTASSIUM-HCTZ 100-25 MG PO TABS: 1.0000 | ORAL_TABLET | Freq: Every day | ORAL | Status: DC

## 2023-08-13 MED ORDER — ACETAMINOPHEN 325 MG PO TABS: 650.0000 mg | ORAL_TABLET | ORAL | Status: DC | PRN

## 2023-08-13 MED ORDER — ROCURONIUM BROMIDE 10 MG/ML (PF) SYRINGE
PREFILLED_SYRINGE | INTRAVENOUS | Status: DC | PRN
  Administered 2023-08-13: 20 mg via INTRAVENOUS
  Administered 2023-08-13: 80 mg via INTRAVENOUS
  Administered 2023-08-13: 30 mg via INTRAVENOUS
  Administered 2023-08-13: 20 mg via INTRAVENOUS

## 2023-08-13 MED ORDER — METHOCARBAMOL 500 MG PO TABS
500.0000 mg | ORAL_TABLET | Freq: Four times a day (QID) | ORAL | Status: DC | PRN
  Administered 2023-08-13 – 2023-08-14 (×2): 500 mg via ORAL
  Filled 2023-08-13 (×2): qty 1

## 2023-08-13 MED ORDER — SODIUM CHLORIDE 0.9% FLUSH
3.0000 mL | Freq: Two times a day (BID) | INTRAVENOUS | Status: DC
Start: 2023-08-13 — End: 2023-08-14
  Administered 2023-08-13: 3 mL via INTRAVENOUS

## 2023-08-13 MED ORDER — TRANEXAMIC ACID-NACL 1000-0.7 MG/100ML-% IV SOLN
1000.0000 mg | INTRAVENOUS | Status: AC
Start: 2023-08-13 — End: 2023-08-14
  Administered 2023-08-14: 1000 mg via INTRAVENOUS
  Filled 2023-08-13: qty 100

## 2023-08-13 MED ORDER — PHENYLEPHRINE HCL (PRESSORS) 10 MG/ML IV SOLN
INTRAVENOUS | Status: DC | PRN
  Administered 2023-08-13 (×5): 80 ug via INTRAVENOUS

## 2023-08-13 MED ORDER — MIDAZOLAM HCL 2 MG/2ML IJ SOLN
INTRAMUSCULAR | Status: AC
  Filled 2023-08-13: qty 2

## 2023-08-13 MED ORDER — CEFAZOLIN SODIUM-DEXTROSE 2-4 GM/100ML-% IV SOLN
INTRAVENOUS | Status: AC
  Filled 2023-08-13: qty 100

## 2023-08-13 MED ORDER — ONDANSETRON HCL 4 MG PO TABS: 4.0000 mg | ORAL_TABLET | Freq: Four times a day (QID) | ORAL | Status: DC | PRN

## 2023-08-13 MED ORDER — THROMBIN 20000 UNITS EX KIT
PACK | CUTANEOUS | Status: AC
  Filled 2023-08-13: qty 1

## 2023-08-13 MED ORDER — SUGAMMADEX SODIUM 200 MG/2ML IV SOLN
INTRAVENOUS | Status: DC | PRN
  Administered 2023-08-13: 200 mg via INTRAVENOUS

## 2023-08-13 MED ORDER — SENNA 8.6 MG PO TABS
1.0000 | ORAL_TABLET | Freq: Two times a day (BID) | ORAL | Status: DC | PRN
  Administered 2023-08-13: 8.6 mg via ORAL
  Filled 2023-08-13: qty 1

## 2023-08-13 MED ORDER — SODIUM CHLORIDE 0.9% FLUSH: 3.0000 mL | INTRAVENOUS | Status: DC | PRN

## 2023-08-13 MED ORDER — OXYCODONE HCL 5 MG PO TABS
10.0000 mg | ORAL_TABLET | ORAL | Status: DC | PRN
  Administered 2023-08-13 – 2023-08-14 (×3): 10 mg via ORAL
  Filled 2023-08-13 (×4): qty 2

## 2023-08-13 MED ORDER — MAGNESIUM CITRATE PO SOLN: 1.0000 | Freq: Once | ORAL | Status: DC | PRN

## 2023-08-13 MED ORDER — FENTANYL CITRATE (PF) 250 MCG/5ML IJ SOLN
INTRAMUSCULAR | Status: DC | PRN
  Administered 2023-08-13: 150 ug via INTRAVENOUS
  Administered 2023-08-13 (×2): 50 ug via INTRAVENOUS

## 2023-08-13 MED ORDER — FENTANYL CITRATE (PF) 100 MCG/2ML IJ SOLN
25.0000 ug | INTRAMUSCULAR | Status: DC | PRN
Start: 2023-08-13 — End: 2023-08-13
  Administered 2023-08-13 (×3): 50 ug via INTRAVENOUS

## 2023-08-13 MED ORDER — KETAMINE HCL 50 MG/5ML IJ SOSY
PREFILLED_SYRINGE | INTRAMUSCULAR | Status: AC
  Filled 2023-08-13: qty 5

## 2023-08-13 MED ORDER — ORAL CARE MOUTH RINSE: 15.0000 mL | Freq: Once | OROMUCOSAL | Status: AC

## 2023-08-13 MED ORDER — FENTANYL CITRATE (PF) 250 MCG/5ML IJ SOLN
INTRAMUSCULAR | Status: AC
  Filled 2023-08-13: qty 5

## 2023-08-13 MED ORDER — ONDANSETRON HCL 4 MG/2ML IJ SOLN
INTRAMUSCULAR | Status: DC | PRN
  Administered 2023-08-13: 4 mg via INTRAVENOUS

## 2023-08-13 MED ORDER — OXYCODONE HCL 5 MG PO TABS: 5.0000 mg | ORAL_TABLET | Freq: Once | ORAL | Status: DC | PRN

## 2023-08-13 MED ORDER — PROPOFOL 10 MG/ML IV BOLUS
INTRAVENOUS | Status: AC
  Filled 2023-08-13: qty 20

## 2023-08-13 MED ORDER — DEXAMETHASONE SODIUM PHOSPHATE 10 MG/ML IJ SOLN
INTRAMUSCULAR | Status: DC | PRN
  Administered 2023-08-13: 5 mg via INTRAVENOUS
  Administered 2023-08-13: 10 mg via INTRAVENOUS

## 2023-08-13 MED ORDER — ACETAMINOPHEN 10 MG/ML IV SOLN
INTRAVENOUS | Status: AC
  Filled 2023-08-13: qty 100

## 2023-08-13 MED ORDER — PHENOL 1.4 % MT LIQD: 1.0000 | OROMUCOSAL | Status: DC | PRN

## 2023-08-13 MED ORDER — FENTANYL CITRATE (PF) 100 MCG/2ML IJ SOLN
INTRAMUSCULAR | Status: AC
  Filled 2023-08-13: qty 2

## 2023-08-13 MED ORDER — ACETAMINOPHEN 650 MG RE SUPP: 650.0000 mg | RECTAL | Status: DC | PRN

## 2023-08-13 MED ORDER — LIDOCAINE 2% (20 MG/ML) 5 ML SYRINGE
INTRAMUSCULAR | Status: AC
  Filled 2023-08-13: qty 5

## 2023-08-13 MED ORDER — BUPIVACAINE-EPINEPHRINE (PF) 0.25% -1:200000 IJ SOLN
INTRAMUSCULAR | Status: AC
  Filled 2023-08-13: qty 30

## 2023-08-13 MED ORDER — LACTATED RINGERS IV SOLN: INTRAVENOUS | Status: DC | PRN

## 2023-08-13 MED ORDER — METHOCARBAMOL 1000 MG/10ML IJ SOLN: 500.0000 mg | Freq: Four times a day (QID) | INTRAMUSCULAR | Status: DC | PRN

## 2023-08-13 MED ORDER — LIDOCAINE 2% (20 MG/ML) 5 ML SYRINGE
INTRAMUSCULAR | Status: DC | PRN
  Administered 2023-08-13: 80 mg via INTRAVENOUS

## 2023-08-13 MED ORDER — ONDANSETRON HCL 4 MG/2ML IJ SOLN
INTRAMUSCULAR | Status: AC
  Filled 2023-08-13: qty 2

## 2023-08-13 MED ORDER — EPHEDRINE SULFATE-NACL 50-0.9 MG/10ML-% IV SOSY
PREFILLED_SYRINGE | INTRAVENOUS | Status: DC | PRN
  Administered 2023-08-13 (×2): 5 mg via INTRAVENOUS
  Administered 2023-08-13: 10 mg via INTRAVENOUS
  Administered 2023-08-13: 5 mg via INTRAVENOUS

## 2023-08-13 MED ORDER — POLYETHYLENE GLYCOL 3350 17 G PO PACK: 17.0000 g | PACK | Freq: Every day | ORAL | Status: DC | PRN

## 2023-08-13 MED ORDER — PROPOFOL 500 MG/50ML IV EMUL
INTRAVENOUS | Status: DC | PRN
  Administered 2023-08-13: 15 ug/kg/min via INTRAVENOUS

## 2023-08-13 MED ORDER — PROPOFOL 10 MG/ML IV BOLUS
INTRAVENOUS | Status: DC | PRN
  Administered 2023-08-13: 110 mg via INTRAVENOUS

## 2023-08-13 MED ORDER — CEFAZOLIN SODIUM-DEXTROSE 2-4 GM/100ML-% IV SOLN
2.0000 g | INTRAVENOUS | Status: AC
  Administered 2023-08-14: 2 g via INTRAVENOUS
  Filled 2023-08-13: qty 100

## 2023-08-13 MED ORDER — SODIUM CHLORIDE 0.9% FLUSH: 3.0000 mL | Freq: Two times a day (BID) | INTRAVENOUS | Status: DC

## 2023-08-13 MED ORDER — ONDANSETRON HCL 4 MG/2ML IJ SOLN: 4.0000 mg | Freq: Four times a day (QID) | INTRAMUSCULAR | Status: DC | PRN

## 2023-08-13 MED ORDER — HYDROMORPHONE HCL 1 MG/ML IJ SOLN
1.0000 mg | INTRAMUSCULAR | Status: DC | PRN
  Administered 2023-08-13 – 2023-08-14 (×2): 1 mg via INTRAVENOUS
  Filled 2023-08-13 (×2): qty 1

## 2023-08-13 MED ORDER — KETAMINE HCL 10 MG/ML IJ SOLN
INTRAMUSCULAR | Status: DC | PRN
  Administered 2023-08-13 (×2): 20 mg via INTRAVENOUS
  Administered 2023-08-13: 10 mg via INTRAVENOUS

## 2023-08-13 MED ORDER — LOSARTAN POTASSIUM 50 MG PO TABS: 100.0000 mg | ORAL_TABLET | Freq: Every day | ORAL | Status: DC

## 2023-08-13 MED ORDER — ROCURONIUM BROMIDE 10 MG/ML (PF) SYRINGE
PREFILLED_SYRINGE | INTRAVENOUS | Status: AC
  Filled 2023-08-13: qty 10

## 2023-08-13 SURGICAL SUPPLY — 76 items
ANCHOR LUMBAR 25 MIS (Anchor) IMPLANT
ANTIFOG SOL W/FOAM PAD STRL (MISCELLANEOUS) ×1 IMPLANT
APPLIER CLIP 11 MED OPEN (CLIP) ×1 IMPLANT
BAG COUNTER SPONGE SURGICOUNT (BAG) ×2 IMPLANT
BLADE CLIPPER SURG (BLADE) IMPLANT
BLADE SURG 10 STRL SS (BLADE) ×1 IMPLANT
CATH FOLEY 2WAY SLVR 5CC 16FR (CATHETERS) ×1 IMPLANT
CLIP APPLIE 11 MED OPEN (CLIP) ×2 IMPLANT
CLIP LIGATING EXTRA MED SLVR (CLIP) IMPLANT
CLSR STERI-STRIP ANTIMIC 1/2X4 (GAUZE/BANDAGES/DRESSINGS) IMPLANT
COVER SURGICAL LIGHT HANDLE (MISCELLANEOUS) ×1 IMPLANT
DRAIN CHANNEL 15F RND FF W/TCR (WOUND CARE) IMPLANT
DRAPE C-ARM 42X72 X-RAY (DRAPES) ×1 IMPLANT
DRAPE C-ARMOR (DRAPES) ×1 IMPLANT
DRAPE INCISE IOBAN 66X45 STRL (DRAPES) IMPLANT
DRAPE POUCH INSTRU U-SHP 10X18 (DRAPES) ×1 IMPLANT
DRAPE SURG 17X23 STRL (DRAPES) ×1 IMPLANT
DRAPE U-SHAPE 47X51 STRL (DRAPES) ×1 IMPLANT
DRSG OPSITE POSTOP 4X8 (GAUZE/BANDAGES/DRESSINGS) ×1 IMPLANT
DURAPREP 26ML APPLICATOR (WOUND CARE) ×1 IMPLANT
ELECT BLADE 4.0 EZ CLEAN MEGAD (MISCELLANEOUS) ×2 IMPLANT
ELECT CAUTERY BLADE 6.4 (BLADE) ×1 IMPLANT
ELECT PENCIL ROCKER SW 15FT (MISCELLANEOUS) ×1 IMPLANT
ELECT REM PT RETURN 9FT ADLT (ELECTROSURGICAL) ×1 IMPLANT
ELECTRODE BLDE 4.0 EZ CLN MEGD (MISCELLANEOUS) ×2 IMPLANT
ELECTRODE REM PT RTRN 9FT ADLT (ELECTROSURGICAL) ×1 IMPLANT
GAUZE 4X4 16PLY ~~LOC~~+RFID DBL (SPONGE) ×2 IMPLANT
GLOVE BIO SURGEON STRL SZ 6.5 (GLOVE) ×1 IMPLANT
GLOVE BIO SURGEON STRL SZ7.5 (GLOVE) ×1 IMPLANT
GLOVE BIOGEL PI IND STRL 6.5 (GLOVE) ×1 IMPLANT
GLOVE BIOGEL PI IND STRL 8 (GLOVE) ×1 IMPLANT
GLOVE BIOGEL PI IND STRL 8.5 (GLOVE) ×1 IMPLANT
GLOVE SS BIOGEL STRL SZ 8.5 (GLOVE) ×1 IMPLANT
GOWN STRL REUS W/ TWL LRG LVL3 (GOWN DISPOSABLE) ×1 IMPLANT
GOWN STRL REUS W/ TWL XL LVL3 (GOWN DISPOSABLE) ×2 IMPLANT
GOWN STRL REUS W/TWL 2XL LVL3 (GOWN DISPOSABLE) ×1 IMPLANT
HEMOSTAT SNOW SURGICEL 2X4 (HEMOSTASIS) IMPLANT
HEMOSTAT SURGICEL 2X14 (HEMOSTASIS) IMPLANT
INSERT FOGARTY 61MM (MISCELLANEOUS) IMPLANT
INSERT FOGARTY SM (MISCELLANEOUS) IMPLANT
KIT BASIN OR (CUSTOM PROCEDURE TRAY) ×1 IMPLANT
KIT TURNOVER KIT B (KITS) ×1 IMPLANT
MIX DBX 20CC MTF (Putty) IMPLANT
NDL SPNL 18GX3.5 QUINCKE PK (NEEDLE) ×1 IMPLANT
NEEDLE SPNL 18GX3.5 QUINCKE PK (NEEDLE) ×1 IMPLANT
NS IRRIG 1000ML POUR BTL (IV SOLUTION) ×1 IMPLANT
PACK LAMINECTOMY ORTHO (CUSTOM PROCEDURE TRAY) ×1 IMPLANT
PACK UNIVERSAL I (CUSTOM PROCEDURE TRAY) ×1 IMPLANT
PAD ARMBOARD POSITIONER FOAM (MISCELLANEOUS) ×4 IMPLANT
SOLUTION ANTFG W/FOAM PAD STRL (MISCELLANEOUS) IMPLANT
SPACER HEDRON IA 29X39 13 15D (Spacer) IMPLANT
SPONGE INTESTINAL PEANUT (DISPOSABLE) ×4 IMPLANT
SPONGE SURGIFOAM ABS GEL 100 (HEMOSTASIS) IMPLANT
SPONGE T-LAP 18X18 ~~LOC~~+RFID (SPONGE) ×1 IMPLANT
SURGIFLO W/THROMBIN 8M KIT (HEMOSTASIS) IMPLANT
SUT BONE WAX W31G (SUTURE) IMPLANT
SUT MNCRL AB 3-0 PS2 27 (SUTURE) ×1 IMPLANT
SUT PDS AB 1 CTX 36 (SUTURE) ×2 IMPLANT
SUT PROLENE 4-0 RB1 .5 CRCL 36 (SUTURE) IMPLANT
SUT PROLENE 5 0 CC1 (SUTURE) IMPLANT
SUT PROLENE 6 0 C 1 30 (SUTURE) IMPLANT
SUT PROLENE 6 0 CC (SUTURE) IMPLANT
SUT SILK 0 TIES 10X30 (SUTURE) IMPLANT
SUT SILK 2 0 TIES 10X30 (SUTURE) ×2 IMPLANT
SUT SILK 2 0SH CR/8 30 (SUTURE) IMPLANT
SUT SILK 3 0 TIES 10X30 (SUTURE) ×1 IMPLANT
SUT SILK 3 0SH CR/8 30 (SUTURE) IMPLANT
SUT SILK 3-0 18XBRD TIE BLK (SUTURE) IMPLANT
SUT VIC AB 0 CT1 36 (SUTURE) IMPLANT
SUT VIC AB 1 CT1 27XBRD ANBCTR (SUTURE) IMPLANT
SUT VIC AB 2-0 CT1 18 (SUTURE) ×1 IMPLANT
SYR BULB IRRIG 60ML STRL (SYRINGE) ×1 IMPLANT
TOWEL GREEN STERILE (TOWEL DISPOSABLE) ×2 IMPLANT
TOWEL GREEN STERILE FF (TOWEL DISPOSABLE) ×1 IMPLANT
WATER STERILE IRR 1000ML POUR (IV SOLUTION) ×1 IMPLANT
YANKAUER SUCT BULB TIP NO VENT (SUCTIONS) IMPLANT

## 2023-08-13 NOTE — Anesthesia Procedure Notes (Signed)
 Procedure Name: Intubation Date/Time: 08/13/2023 7:50 AM  Performed by: Dawna Etienne, CRNAPre-anesthesia Checklist: Patient identified, Patient being monitored, Timeout performed, Emergency Drugs available and Suction available Patient Re-evaluated:Patient Re-evaluated prior to induction Oxygen Delivery Method: Circle System Utilized Preoxygenation: Pre-oxygenation with 100% oxygen Induction Type: IV induction Ventilation: Mask ventilation without difficulty Laryngoscope Size: Miller and 2 Grade View: Grade I Tube type: Oral Tube size: 7.5 mm Number of attempts: 1 Airway Equipment and Method: Stylet Placement Confirmation: ETT inserted through vocal cords under direct vision, positive ETCO2 and breath sounds checked- equal and bilateral Secured at: 23 cm Tube secured with: Tape Dental Injury: Teeth and Oropharynx as per pre-operative assessment
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
## 2023-08-13 NOTE — Op Note (Signed)
 OPERATIVE REPORT  DATE OF SURGERY: 08/13/2023  PATIENT NAME:  [name] MRN: [id number] DOB: [birth year]  PCP: Patient, No Pcp Per  PRE-OPERATIVE DIAGNOSIS:  Degenerative lumbar disc disease with discogenic back buttock and radicular leg.  L4-S1  POST-OPERATIVE DIAGNOSIS: Same  PROCEDURE:   Anterior lumbar interbody fusion L4-5 and L5-S1  SURGEON:  Mort Ards, MD  PHYSICIAN ASSISTANT: Missy Amos  Approach Surgeon: Dr. Jimmye Moulds  ANESTHESIA:   General  EBL: 125 ml   Complications: None  Implants: Globus Hedron IA cage L4-5 and L5-S1:  13mm large with 15 degrees lordosis.  25 mm anchors.  Graft: DBX mix  BRIEF HISTORY: [name] is a 63 y.o. male who has had progressive debilitating back buttock and intermittent neuropathic leg pain as result of a work-related injury.  Despite prolonged details conservative management his quality of life continues to deteriorate.  Ultimately the patient elected to move forward with surgery.  All appropriate risks, benefits, and alternatives were discussed and consent was obtained.  PROCEDURE DETAILS: Patient was brought into the operating room and was properly positioned on the operating room table.  After induction with general anesthesia the patient was endotracheally intubated.  A timeout was taken to confirm all important data: including patient, procedure, and the level. Teds, SCD's were applied.   He was placed by the nurse in the anterior abdomen was prepped and draped in the standard fashion.  Using fluoroscopy we marked out the incision site for the L4-5 and L5-S1 anterior approach.  It was decided to use a vertical incision.  Please refer to the dictation by Dr. Fulton Job for specifics on the approach.  Once the retractors were placed in the L5-S1 disc space was clearly visualized and the needle was placed into the disc space.  An x-ray was taken confirming that I was at the appropriate level.  At this point Dr. Fulton Job scrubbed  out and then proceeded with the surgery.  An annulotomy at L5-S1 was performed with a 10 blade scalpel.  The bulk of the disc material was removed with pituitary rongeurs and curettes.  As I continued to work posteriorly I mobilized and removed all disc material.  I then distracted the intervertebral space and continue to work posteriorly.  I ultimately was able to identify the posterior aspect of the vertebral body of S1.  Using a fine curved curette I released this from the posterior aspect of the vertebral body. I then used a nerve hook to sweep underneath the annulus and lifted up.  I used my 2 mm Kerrison rongeur now resect the posterior annulus and remove some of the osteophytes from the posterior aspect of the vertebral body of L5 and S1.  Once the discectomy proved satisfactory I placed a lamina spreader in the intervertebral space.  Under live fluoroscopy I confirmed had parallel endplate distraction and that the foraminal space improved.   I then rasped the endplates until I had bleeding subchondral bone.  At this point I was quite pleased with the discectomy and the indirect foraminal decompression that I achieved.  I then placed the trialing devices and elected to use the 13 mm large 15 degree lordotic implant.  The cage was obtained and packed with DBX mixed.  The wound was irrigated with normal saline and then I inserted the cage.  The cage was slightly countersunk.  Final imaging demonstrated satisfactory positioning of the cage.  The locking staples were then engaged.  I removed the inserting device and locked the  staples to prevent backout.  The wound was now irrigated copiously normal saline.  At this point in time Dr. Fulton Job scrubbed back into the case and repositioned the retractors to expose the L4-5 level.  Once this was done I proceeded with the discectomy.  Using the same technique I fused at L5-S1 an annulotomy at L4-5 and I removed the disc material.  I again used curettes, pituitary  rongeurs, nerve to assist in completing the discectomy at this level.  Once I was at the posterior aspect of the vertebral body I placed my fine curved curette in the vertebral body of L5 and released the annulus.  I then used my 2 mm Kerrison rongeur to resect the remaining portion of the annulus and remove any bone spurs around the posterior aspect of the vertebral bodies.  Spreader was then placed in the intervertebral space and under live fluoroscopy I confirmed satisfactory decompression with parallel endplate motion and improved foraminal space.  I then trialed the intervertebral spacers and elected to use the same 1 that I used at L5-S1.  The cage was packed and malleted to the appropriate depth.  The locking anchors were then placed.  The locking device was then engaged on the plate according manufacture standards.  The wound was copiously irrigated with normal saline and I placed Floseal in and around the iliac vessels.  The retractors were removed.  I then reapproximated the fascia of the rectus with a running #1 strata fix suture.  X-rays were then taken and read by the radiologist confirming there were no retained surgical instruments in the wound.  The rest of the wound was irrigated and closed in a layered fashion with a running 0 Vicryl suture, interrupted 2-0 Vicryl sutures, and then 3-0 Monocryl for the skin.  Steri-Strips and a dry dressing were applied and the patient was ultimately extubated and transferred to the PACU without incident.  The patient will return to the OR tomorrow for planned posterior instrumentation and possible decompression depending upon the degree to which his radicular leg pain has resolved.  Mort Ards, MD 08/13/2023 11:18 AM

## 2023-08-13 NOTE — Op Note (Signed)
 Date: August 13, 2023  Preoperative diagnosis: Chronic lower back pain  Postoperative diagnosis: Same  Procedure: Anterior spine exposure via anterior retroperitoneal approach for L4-L5 and L5-S1 ALIF  Surgeon: Dr. Cephus Shelling, MD  Co-surgeon: Dr. Venita Lick, MD  Assistant: Dr. Ladonna Snide, MD and Roderic Palau, [place]  Indications: 63 year old male with chronic lower back pain that presents for anterior spine exposure of L4-L5 and L5-S1 for ALIF.  Risk benefits discussed.  Findings: Paramedian incision over the left rectus muscle at the L4-L5 and L5-S1 disc space.  Anterior rectus sheath was opened and mobilized left rectus to the midline and then entered into the retroperitoneum and mobilized peritoneum and left ureter across midline.  The posterior rectus sheath was opened above arcuate line to get the L4-L5 disc exposed.  Ultimately initially went to the L5-S1 disc space and ligated the middle sacral vessels and then mobilized on both sides of the disc space.  I then moved up to L4-L5 level and had to mobilize left iliac artery and vein medially.  I did have a small tear in the left iliac vein that was repaired with 4-0 and  5-0 Prolene.  Multiple iliolumbar branches and segmental branch were ligated.  Retractors initially placed at L5-S1 and then I came back and move them to L4-L5 after the implant was complete at L5-S1.  Anesthesia: General  Details: Patient was taken to the operating room after informed consent was obtained.  Placed on operative table in the supine position.  General endotracheal anesthesia was induced.  Fluoroscopic C-arm was then used in the lateral position to mark the L4-L5 and L5-S1 disc space over the left rectus muscle.  The abdominal wall was then prepped and draped in standard sterile fashion.  Antibiotics were given and timeout performed.  Initially made a paramedian incision over the left rectus muscle and dissected down with Bovie cautery using  cerebellar retractors.  I got all the way down to the fascia and the this was open longitudinally over the left rectus muscle with Bovie cautery.  I raised flaps underneath the anterior rectus sheath.  The left rectus was mobilized to the midline and then I entered lateral to the muscle into the retroperitoneum and mobilized peritoneum and left ureter to the midline with blunt dissection using a Kd.  I mobilized peritoneum off of posterior rectus sheath above arcuate line and this was opened with Metzenbaum scissors to get more exposure superiorly.  I then had my assistant use hand-held Wiley retractors to pull the peritoneum and left ureter to the midline.  I mobilized all the way to the L5-S1 disc space and then divided the middle sacral vessels between 2-0 silk ties clips and this was divided.  I fully mobilized the left iliac vein lateral.  We had good working room anteriorly once we got on both sides of the disc space.  I then went up one level and the left iliac artery was mobilized to the midline with Wiley retractors and I visualized the left iliac vein and there were several iliolumbar branches that were ligated between 2-0 silk ties clips and these were divided.  I also ligated one segmental branch.  I did have an injury to the left iliac vein had to be repaired with multiple 4-0 and 5-0 Prolene's with good hemostasis.  I then mobilized the left iliac artery and vein to the midline and mobilized all the way across the L4-L5 disc space.  Ultimately I initially placed a fixed  NuVasive retractor at the L5-S1 disc level with 160 reverse lips on each side and a 120 reverse lip cranial and 140 reverse lip caudal.  We had good anterior exposure.  I put a needle in the disc space and confirmed I was at the correct level.  Case was turned over to Dr. Vaughn Georges.  I was called back in the room after the implant was completed at L5-S1.  I then removed the retractors and moved up one level again using hand-held Wiley  retractors for the iliac artery and vein that was again mobilized to the midline.  I then put a 160 reverse lip on each side of the disc space at the L4-L5 level with a 120 reverse lip cranial 140 reverse lip caudal.  Case was turned over to Dr. Vaughn Georges.  Complication: None  Condition: Stable  Bradley Hensen, MD [facility] Office: [phone]   [name]

## 2023-08-13 NOTE — Transfer of Care (Signed)
 Immediate Anesthesia Transfer of Care Note  Patient: Cailen Mihalik  Procedure(s) Performed: ANTERIOR LUMBAR INTERBODY FUSION LUMBAR FOUR TO SACRAL ONE ABDOMINAL EXPOSURE FOR ANTERIOR LUMBAR SPINE SURGERY  Patient Location: PACU  Anesthesia Type:General  Level of Consciousness: drowsy, patient cooperative, and responds to stimulation  Airway & Oxygen Therapy: Patient Spontanous Breathing and Patient connected to face mask oxygen  Post-op Assessment: Report given to RN, Post -op Vital signs reviewed and stable, and Patient moving all extremities X 4  Post vital signs: Reviewed and stable  Last Vitals:  Vitals Value Taken Time  BP 120/64 08/13/23 1201  Temp    Pulse 94 08/13/23 1206  Resp 18 08/13/23 1206  SpO2 98 % 08/13/23 1206  Vitals shown include unfiled device data.  Last Pain:  Vitals:   08/13/23 0619  TempSrc:   PainSc: 10-Worst pain ever      Patients Stated Pain Goal: 8 (08/13/23 0454)  Complications: No notable events documented.

## 2023-08-13 NOTE — Brief Op Note (Signed)
 08/13/2023  11:31 AM  PATIENT:  [name]  63 y.o. male  PRE-OPERATIVE DIAGNOSIS:  Degenerative disc disease with discogenic lumbar back pain and radicular leg pain  POST-OPERATIVE DIAGNOSIS:  Degenerative disc disease with discogenic lumbar back pain and radicular leg pain  PROCEDURE:  Procedure(s) with comments: ANTERIOR LUMBAR INTERBODY FUSION LUMBAR FOUR TO SACRAL ONE (N/A) - ALIF L4-S1 ABDOMINAL EXPOSURE FOR ANTERIOR LUMBAR SPINE SURGERY (N/A)  SURGEON:  Surgeons and Role: Panel 1:    Mort Ards, MD - Primary Panel 2:    [name], MD - Primary    * Kayla Part, MD - Assisting  ASSISTANTS: Letty Raya, PA   ANESTHESIA:   general  EBL:  200 mL   BLOOD ADMINISTERED:none  DRAINS: none   LOCAL MEDICATIONS USED:  MARCAINE     SPECIMEN:  No Specimen  DISPOSITION OF SPECIMEN:  N/A  COUNTS:  YES  TOURNIQUET:  * No tourniquets in log *  DICTATION: .Dragon Dictation  PLAN OF CARE: Admit to inpatient   PATIENT DISPOSITION:  PACU - hemodynamically stable.
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
## 2023-08-14 ENCOUNTER — Other Ambulatory Visit: Payer: Self-pay

## 2023-08-14 ENCOUNTER — Encounter (HOSPITAL_COMMUNITY): Payer: Self-pay | Admitting: Orthopedic Surgery

## 2023-08-14 ENCOUNTER — Inpatient Hospital Stay (HOSPITAL_COMMUNITY): Admitting: Anesthesiology

## 2023-08-14 ENCOUNTER — Inpatient Hospital Stay (HOSPITAL_COMMUNITY)

## 2023-08-14 ENCOUNTER — Encounter (HOSPITAL_COMMUNITY)
Admission: RE | Disposition: A | Discharge status: Home / Self Care | Payer: Self-pay | Source: Home / Self Care | Attending: Orthopedic Surgery

## 2023-08-14 ENCOUNTER — Inpatient Hospital Stay (HOSPITAL_COMMUNITY): Admission: RE | Admit: 2023-08-14 | Source: Home / Self Care | Admitting: Orthopedic Surgery

## 2023-08-14 DIAGNOSIS — F32A Depression, unspecified: Secondary | ICD-10-CM

## 2023-08-14 DIAGNOSIS — M5136 Other intervertebral disc degeneration, lumbar region with discogenic back pain only: Secondary | ICD-10-CM | POA: Diagnosis not present

## 2023-08-14 DIAGNOSIS — I10 Essential (primary) hypertension: Secondary | ICD-10-CM | POA: Diagnosis not present

## 2023-08-14 LAB — CBC
HCT: 36.5 % — ABNORMAL LOW (ref 39.0–52.0)
Hemoglobin: 12.8 g/dL — ABNORMAL LOW (ref 13.0–17.0)
MCH: 32.7 pg (ref 26.0–34.0)
MCHC: 35.1 g/dL (ref 30.0–36.0)
MCV: 93.4 fL (ref 80.0–100.0)
Platelets: 196 10*3/uL (ref 150–400)
RBC: 3.91 MIL/uL — ABNORMAL LOW (ref 4.22–5.81)
RDW: 13.2 % (ref 11.5–15.5)
WBC: 13.1 10*3/uL — ABNORMAL HIGH (ref 4.0–10.5)
nRBC: 0 % (ref 0.0–0.2)

## 2023-08-14 LAB — TYPE AND SCREEN
ABO/RH(D): A POS
Antibody Screen: NEGATIVE

## 2023-08-14 LAB — CREATININE, SERUM
Creatinine, Ser: 1.22 mg/dL (ref 0.61–1.24)
GFR, Estimated: >60 mL/min (ref >60)

## 2023-08-14 SURGERY — POSTERIOR LUMBAR FUSION 2 LEVEL
Anesthesia: General

## 2023-08-14 MED ORDER — DEXAMETHASONE SODIUM PHOSPHATE 10 MG/ML IJ SOLN
INTRAMUSCULAR | Status: AC
  Filled 2023-08-14: qty 1

## 2023-08-14 MED ORDER — ACETAMINOPHEN 325 MG PO TABS
650.0000 mg | ORAL_TABLET | ORAL | Status: DC | PRN
  Administered 2023-08-15 – 2023-08-19 (×4): 650 mg via ORAL
  Filled 2023-08-14 (×4): qty 2

## 2023-08-14 MED ORDER — LIDOCAINE 2% (20 MG/ML) 5 ML SYRINGE
INTRAMUSCULAR | Status: AC
  Filled 2023-08-14: qty 5

## 2023-08-14 MED ORDER — ONDANSETRON HCL 4 MG PO TABS: 4.0000 mg | ORAL_TABLET | Freq: Four times a day (QID) | ORAL | Status: DC | PRN

## 2023-08-14 MED ORDER — PROPOFOL 500 MG/50ML IV EMUL
INTRAVENOUS | Status: AC
  Filled 2023-08-14: qty 50

## 2023-08-14 MED ORDER — THROMBIN 20000 UNITS EX KIT
PACK | CUTANEOUS | Status: AC
  Filled 2023-08-14: qty 1

## 2023-08-14 MED ORDER — PROPOFOL 10 MG/ML IV BOLUS
INTRAVENOUS | Status: DC | PRN
  Administered 2023-08-14: 170 mg via INTRAVENOUS
  Administered 2023-08-14: 30 mg via INTRAVENOUS

## 2023-08-14 MED ORDER — ORAL CARE MOUTH RINSE: 15.0000 mL | Freq: Once | OROMUCOSAL | Status: AC

## 2023-08-14 MED ORDER — ONDANSETRON HCL 4 MG/2ML IJ SOLN: 4.0000 mg | Freq: Four times a day (QID) | INTRAMUSCULAR | Status: DC | PRN

## 2023-08-14 MED ORDER — ONDANSETRON HCL 4 MG/2ML IJ SOLN
INTRAMUSCULAR | Status: DC | PRN
Start: 2023-08-14 — End: 2023-08-14
  Administered 2023-08-14: 4 mg via INTRAVENOUS

## 2023-08-14 MED ORDER — SODIUM CHLORIDE 0.9% FLUSH: 3.0000 mL | INTRAVENOUS | Status: DC | PRN

## 2023-08-14 MED ORDER — ENOXAPARIN SODIUM 40 MG/0.4ML IJ SOSY
40.0000 mg | PREFILLED_SYRINGE | INTRAMUSCULAR | Status: DC
  Administered 2023-08-15 – 2023-08-23 (×9): 40 mg via SUBCUTANEOUS
  Filled 2023-08-14 (×9): qty 0.4

## 2023-08-14 MED ORDER — MIDAZOLAM HCL 2 MG/2ML IJ SOLN
INTRAMUSCULAR | Status: AC
  Filled 2023-08-14: qty 2

## 2023-08-14 MED ORDER — THROMBIN 20000 UNITS EX SOLR
CUTANEOUS | Status: DC | PRN
  Administered 2023-08-14: 8 mL via TOPICAL

## 2023-08-14 MED ORDER — POLYETHYLENE GLYCOL 3350 17 G PO PACK
17.0000 g | PACK | Freq: Every day | ORAL | Status: DC | PRN
  Administered 2023-08-19: 17 g via ORAL
  Filled 2023-08-14: qty 1

## 2023-08-14 MED ORDER — ACETAMINOPHEN 10 MG/ML IV SOLN
INTRAVENOUS | Status: DC | PRN
  Administered 2023-08-14: 1000 mg via INTRAVENOUS

## 2023-08-14 MED ORDER — METHOCARBAMOL 500 MG PO TABS
500.0000 mg | ORAL_TABLET | Freq: Four times a day (QID) | ORAL | Status: DC | PRN
  Administered 2023-08-14 – 2023-08-16 (×5): 500 mg via ORAL
  Filled 2023-08-14 (×5): qty 1

## 2023-08-14 MED ORDER — 0.9 % SODIUM CHLORIDE (POUR BTL) OPTIME
TOPICAL | Status: DC | PRN
  Administered 2023-08-14 (×2): 1000 mL

## 2023-08-14 MED ORDER — LIDOCAINE 2% (20 MG/ML) 5 ML SYRINGE
INTRAMUSCULAR | Status: DC | PRN
  Administered 2023-08-14: 80 mg via INTRAVENOUS

## 2023-08-14 MED ORDER — SUGAMMADEX SODIUM 200 MG/2ML IV SOLN
INTRAVENOUS | Status: DC | PRN
  Administered 2023-08-14: 200 mg via INTRAVENOUS

## 2023-08-14 MED ORDER — KETAMINE HCL 50 MG/5ML IJ SOSY
PREFILLED_SYRINGE | INTRAMUSCULAR | Status: AC
  Filled 2023-08-14: qty 5

## 2023-08-14 MED ORDER — CHLORHEXIDINE GLUCONATE 0.12 % MT SOLN: 15.0000 mL | Freq: Once | OROMUCOSAL | Status: AC

## 2023-08-14 MED ORDER — PHENOL 1.4 % MT LIQD
1.0000 | OROMUCOSAL | Status: DC | PRN
  Administered 2023-08-15: 1 via OROMUCOSAL
  Filled 2023-08-14: qty 177

## 2023-08-14 MED ORDER — CHLORHEXIDINE GLUCONATE CLOTH 2 % EX PADS: 6.0000 | MEDICATED_PAD | Freq: Every day | CUTANEOUS | Status: DC

## 2023-08-14 MED ORDER — ROCURONIUM BROMIDE 10 MG/ML (PF) SYRINGE
PREFILLED_SYRINGE | INTRAVENOUS | Status: AC
  Filled 2023-08-14: qty 10

## 2023-08-14 MED ORDER — CHLORHEXIDINE GLUCONATE 0.12 % MT SOLN
OROMUCOSAL | Status: AC
  Administered 2023-08-14: 15 mL via OROMUCOSAL
  Filled 2023-08-14: qty 15

## 2023-08-14 MED ORDER — OXYCODONE HCL 5 MG PO TABS
5.0000 mg | ORAL_TABLET | ORAL | Status: DC | PRN
  Administered 2023-08-17 – 2023-08-23 (×4): 5 mg via ORAL
  Filled 2023-08-14 (×6): qty 1

## 2023-08-14 MED ORDER — MAGNESIUM CITRATE PO SOLN
1.0000 | Freq: Once | ORAL | Status: AC | PRN
  Administered 2023-08-20: 1 via ORAL
  Filled 2023-08-14: qty 296

## 2023-08-14 MED ORDER — PROPOFOL 500 MG/50ML IV EMUL
INTRAVENOUS | Status: DC | PRN
  Administered 2023-08-14: 100 ug/kg/min via INTRAVENOUS
  Administered 2023-08-14: 50 ug/kg/min via INTRAVENOUS

## 2023-08-14 MED ORDER — NAPHAZOLINE-PHENIRAMINE 0.025-0.3 % OP SOLN
1.0000 [drp] | Freq: Four times a day (QID) | OPHTHALMIC | Status: DC | PRN
  Administered 2023-08-14 – 2023-08-15 (×2): 1 [drp] via OPHTHALMIC
  Filled 2023-08-14: qty 15

## 2023-08-14 MED ORDER — HYDROMORPHONE HCL 1 MG/ML IJ SOLN
1.0000 mg | INTRAMUSCULAR | Status: AC | PRN
  Administered 2023-08-14 – 2023-08-15 (×3): 1 mg via INTRAVENOUS
  Filled 2023-08-14 (×3): qty 1

## 2023-08-14 MED ORDER — THROMBIN 20000 UNITS EX SOLR
CUTANEOUS | Status: AC
  Filled 2023-08-14: qty 20000

## 2023-08-14 MED ORDER — ACETAMINOPHEN 650 MG RE SUPP: 650.0000 mg | RECTAL | Status: DC | PRN

## 2023-08-14 MED ORDER — MIDAZOLAM HCL 2 MG/2ML IJ SOLN
INTRAMUSCULAR | Status: DC | PRN
  Administered 2023-08-14: 2 mg via INTRAVENOUS

## 2023-08-14 MED ORDER — SODIUM CHLORIDE 0.9% FLUSH
3.0000 mL | Freq: Two times a day (BID) | INTRAVENOUS | Status: DC
  Administered 2023-08-14 – 2023-08-22 (×10): 3 mL via INTRAVENOUS

## 2023-08-14 MED ORDER — MENTHOL 3 MG MT LOZG
1.0000 | LOZENGE | OROMUCOSAL | Status: DC | PRN
  Filled 2023-08-14: qty 9

## 2023-08-14 MED ORDER — ALBUMIN HUMAN 5 % IV SOLN: INTRAVENOUS | Status: DC | PRN

## 2023-08-14 MED ORDER — DEXAMETHASONE SODIUM PHOSPHATE 10 MG/ML IJ SOLN
INTRAMUSCULAR | Status: DC | PRN
  Administered 2023-08-14: 10 mg via INTRAVENOUS

## 2023-08-14 MED ORDER — LACTATED RINGERS IV SOLN: INTRAVENOUS | Status: DC

## 2023-08-14 MED ORDER — METHOCARBAMOL 1000 MG/10ML IJ SOLN: 500.0000 mg | Freq: Four times a day (QID) | INTRAMUSCULAR | Status: DC | PRN

## 2023-08-14 MED ORDER — KETAMINE HCL 10 MG/ML IJ SOLN
INTRAMUSCULAR | Status: DC | PRN
  Administered 2023-08-14: 30 mg via INTRAVENOUS
  Administered 2023-08-14: 20 mg via INTRAVENOUS

## 2023-08-14 MED ORDER — OXYCODONE HCL 5 MG PO TABS
10.0000 mg | ORAL_TABLET | ORAL | Status: DC | PRN
  Administered 2023-08-14 – 2023-08-23 (×34): 10 mg via ORAL
  Filled 2023-08-14 (×33): qty 2

## 2023-08-14 MED ORDER — ROCURONIUM BROMIDE 10 MG/ML (PF) SYRINGE
PREFILLED_SYRINGE | INTRAVENOUS | Status: DC | PRN
Start: 2023-08-14 — End: 2023-08-14
  Administered 2023-08-14: 60 mg via INTRAVENOUS

## 2023-08-14 MED ORDER — ONDANSETRON HCL 4 MG/2ML IJ SOLN
INTRAMUSCULAR | Status: AC
  Filled 2023-08-14: qty 2

## 2023-08-14 MED ORDER — PHENYLEPHRINE HCL-NACL 20-0.9 MG/250ML-% IV SOLN
INTRAVENOUS | Status: DC | PRN
  Administered 2023-08-14: 25 ug/min via INTRAVENOUS

## 2023-08-14 MED ORDER — BUPIVACAINE-EPINEPHRINE 0.25% -1:200000 IJ SOLN
INTRAMUSCULAR | Status: DC | PRN
  Administered 2023-08-14: 10 mL

## 2023-08-14 MED ORDER — FENTANYL CITRATE (PF) 250 MCG/5ML IJ SOLN
INTRAMUSCULAR | Status: AC
  Filled 2023-08-14: qty 5

## 2023-08-14 MED ORDER — PROPOFOL 1000 MG/100ML IV EMUL
INTRAVENOUS | Status: AC
  Filled 2023-08-14: qty 100

## 2023-08-14 MED ORDER — SUCCINYLCHOLINE CHLORIDE 200 MG/10ML IV SOSY
PREFILLED_SYRINGE | INTRAVENOUS | Status: AC
  Filled 2023-08-14: qty 10

## 2023-08-14 MED ORDER — CEFAZOLIN SODIUM-DEXTROSE 1-4 GM/50ML-% IV SOLN
1.0000 g | Freq: Three times a day (TID) | INTRAVENOUS | Status: AC
  Administered 2023-08-14 – 2023-08-15 (×2): 1 g via INTRAVENOUS
  Filled 2023-08-14 (×2): qty 50

## 2023-08-14 MED ORDER — FENTANYL CITRATE (PF) 250 MCG/5ML IJ SOLN
INTRAMUSCULAR | Status: DC | PRN
  Administered 2023-08-14 (×2): 50 ug via INTRAVENOUS
  Administered 2023-08-14: 100 ug via INTRAVENOUS
  Administered 2023-08-14: 50 ug via INTRAVENOUS

## 2023-08-14 MED ORDER — BUPIVACAINE-EPINEPHRINE (PF) 0.25% -1:200000 IJ SOLN
INTRAMUSCULAR | Status: AC
  Filled 2023-08-14: qty 30

## 2023-08-14 MED ORDER — SODIUM CHLORIDE 0.9 % IV SOLN
250.0000 mL | INTRAVENOUS | Status: AC
  Administered 2023-08-14: 250 mL via INTRAVENOUS

## 2023-08-14 MED ORDER — PHENYLEPHRINE HCL-NACL 20-0.9 MG/250ML-% IV SOLN
INTRAVENOUS | Status: AC
  Filled 2023-08-14: qty 250

## 2023-08-14 MED ORDER — FENTANYL CITRATE (PF) 100 MCG/2ML IJ SOLN: 25.0000 ug | INTRAMUSCULAR | Status: DC | PRN

## 2023-08-14 SURGICAL SUPPLY — 67 items
BAG COUNTER SPONGE SURGICOUNT (BAG) ×1 IMPLANT
BLADE CLIPPER SURG (BLADE) IMPLANT
BUR EGG ELITE 4.0 (BURR) ×1 IMPLANT
BUR MATCHSTICK NEURO 3.0X3.8 (BURR) ×1 IMPLANT
CABLE BIPOLOR RESECTION CORD (MISCELLANEOUS) ×1 IMPLANT
CAP RELINE MOD TULIP RMM (Cap) IMPLANT
CLIP NEUROVISION LG (NEUROSURGERY SUPPLIES) IMPLANT
CLSR STERI-STRIP ANTIMIC 1/2X4 (GAUZE/BANDAGES/DRESSINGS) ×1 IMPLANT
COVER MAYO STAND STRL (DRAPES) IMPLANT
COVER SURGICAL LIGHT HANDLE (MISCELLANEOUS) ×1 IMPLANT
DRAIN CHANNEL 15F RND FF W/TCR (WOUND CARE) IMPLANT
DRAPE C-ARM 42X72 X-RAY (DRAPES) ×1 IMPLANT
DRAPE C-ARMOR (DRAPES) ×1 IMPLANT
DRAPE SURG 17X23 STRL (DRAPES) ×1 IMPLANT
DRAPE U-SHAPE 47X51 STRL (DRAPES) ×1 IMPLANT
DRSG OPSITE POSTOP 4X6 (GAUZE/BANDAGES/DRESSINGS) IMPLANT
DRSG OPSITE POSTOP 4X8 (GAUZE/BANDAGES/DRESSINGS) ×1 IMPLANT
DURAPREP 26ML APPLICATOR (WOUND CARE) ×1 IMPLANT
ELECT BLADE 4.0 EZ CLEAN MEGAD (MISCELLANEOUS) IMPLANT
ELECT BLADE 6.5 EXT (BLADE) ×1 IMPLANT
ELECT CAUTERY BLADE 6.4 (BLADE) ×1 IMPLANT
ELECT NVM5 SURFACE MEP/EMG (ELECTRODE) IMPLANT
ELECT PENCIL ROCKER SW 15FT (MISCELLANEOUS) ×1 IMPLANT
ELECT REM PT RETURN 9FT ADLT (ELECTROSURGICAL) ×1 IMPLANT
ELECTRODE BLDE 4.0 EZ CLN MEGD (MISCELLANEOUS) IMPLANT
ELECTRODE REM PT RTRN 9FT ADLT (ELECTROSURGICAL) ×1 IMPLANT
GLOVE BIOGEL PI IND STRL 8.5 (GLOVE) ×1 IMPLANT
GLOVE SS BIOGEL STRL SZ 8.5 (GLOVE) ×1 IMPLANT
GOWN STRL REUS W/TWL 2XL LVL3 (GOWN DISPOSABLE) ×2 IMPLANT
KIT BASIN OR (CUSTOM PROCEDURE TRAY) ×1 IMPLANT
KIT POSITION SURG JACKSON T1 (MISCELLANEOUS) ×1 IMPLANT
KIT TURNOVER KIT B (KITS) ×1 IMPLANT
MIX DBX 20CC MTF (Putty) IMPLANT
MODULE EMG NDL SSEP NVM5 (NEUROSURGERY SUPPLIES) IMPLANT
MODULE EMG NEEDLE SSEP NVM5 (NEUROSURGERY SUPPLIES) ×1 IMPLANT
NDL 22X1.5 STRL (OR ONLY) (MISCELLANEOUS) ×1 IMPLANT
NDL SPNL 18GX3.5 QUINCKE PK (NEEDLE) ×1 IMPLANT
NEEDLE 22X1.5 STRL (OR ONLY) (MISCELLANEOUS) ×1 IMPLANT
NEEDLE SPNL 18GX3.5 QUINCKE PK (NEEDLE) ×1 IMPLANT
NS IRRIG 1000ML POUR BTL (IV SOLUTION) ×1 IMPLANT
PACK LAMINECTOMY ORTHO (CUSTOM PROCEDURE TRAY) ×1 IMPLANT
PACK UNIVERSAL I (CUSTOM PROCEDURE TRAY) ×1 IMPLANT
PAD ARMBOARD POSITIONER FOAM (MISCELLANEOUS) ×2 IMPLANT
PATTIES SURGICAL .5 X.5 (GAUZE/BANDAGES/DRESSINGS) IMPLANT
PATTIES SURGICAL .5 X1 (DISPOSABLE) ×1 IMPLANT
POSITIONER HEAD PRONE TRACH (MISCELLANEOUS) ×1 IMPLANT
PROBE BALL TIP NVM5 SNG USE (NEUROSURGERY SUPPLIES) IMPLANT
ROD RELINE COCR LORD 5.0X65 (Rod) IMPLANT
SCREW LOCK RSS 4.5/5.0MM (Screw) IMPLANT
SHANK RELINE MOD 5.5X40 (Screw) IMPLANT
SHANK RELINE O MOD 6.5X45 (Screw) IMPLANT
SPONGE SURGIFOAM ABS GEL 100 (HEMOSTASIS) ×1 IMPLANT
SPONGE T-LAP 4X18 ~~LOC~~+RFID (SPONGE) ×2 IMPLANT
SURGIFLO W/THROMBIN 8M KIT (HEMOSTASIS) IMPLANT
SUT BONE WAX W31G (SUTURE) ×1 IMPLANT
SUT MNCRL+ AB 3-0 CT1 36 (SUTURE) ×1 IMPLANT
SUT STRATAFIX 1PDS 45CM VIOLET (SUTURE) IMPLANT
SUT VIC AB 0 CT1 36 (SUTURE) IMPLANT
SUT VIC AB 1 CT1 18XCR BRD 8 (SUTURE) ×1 IMPLANT
SUT VIC AB 2-0 CT1 18 (SUTURE) ×1 IMPLANT
SYR BULB IRRIG 60ML STRL (SYRINGE) ×1 IMPLANT
SYR CONTROL 10ML LL (SYRINGE) ×1 IMPLANT
TOWEL GREEN STERILE (TOWEL DISPOSABLE) ×1 IMPLANT
TOWEL GREEN STERILE FF (TOWEL DISPOSABLE) ×1 IMPLANT
TRAY FOLEY MTR SLVR 16FR STAT (SET/KITS/TRAYS/PACK) ×1 IMPLANT
WATER STERILE IRR 1000ML POUR (IV SOLUTION) ×1 IMPLANT
YANKAUER SUCT BULB TIP NO VENT (SUCTIONS) ×1 IMPLANT

## 2023-08-14 NOTE — Transfer of Care (Addendum)
 Immediate Anesthesia Transfer of Care Note  Patient: [name]  Procedure(s) Performed: POSTERIOR LUMBAR FUSION 2 LEVEL  Patient Location: PACU  Anesthesia Type:General  Level of Consciousness: awake and alert   Airway & Oxygen Therapy: Patient Spontanous Breathing  Post-op Assessment: Report given to RN and Post -op Vital signs reviewed and stable  Post vital signs: Reviewed  Last Vitals:  Vitals Value Taken Time  BP 130/88 08/14/23 1645  Temp 37.1 C 08/14/23 1620  Pulse 89 08/14/23 1647  Resp 14 08/14/23 1647  SpO2 97 % 08/14/23 1647  Vitals shown include unfiled device data.  Last Pain:  Vitals:   08/14/23 1620  TempSrc:   PainSc: Asleep      Patients Stated Pain Goal: 3 (08/14/23 1033)  Complications: No notable events documented.

## 2023-08-14 NOTE — Progress Notes (Signed)
 [facility]  Subjective  -some left-sided pain at the abdominal incision   Objective 133/88 99 98.3 F (36.8 C) (Oral) 20 97%  Intake/Output Summary (Last 24 hours) at 08/14/2023 0629 Last data filed at 08/14/2023 0539 Gross per 24 hour  Intake 3490 ml  Output 4100 ml  Net -610 ml    Left paramedian incision without hematoma Appropriate postop incisional tenderness Left DP palpable  Laboratory Lab Results: No results for input(s): "WBC", "HGB", "HCT", "PLT" in the last 72 hours. BMET No results for input(s): "NA", "K", "CL", "CO2", "GLUCOSE", "BUN", "CREATININE", "CALCIUM" in the last 72 hours.  COAG Lab Results  Component Value Date   INR 0.9 02/10/2007   No results found for: "PTT"  Assessment/Planning:  Postop day 1 status post anterior spine exposure for L4-L5 and L5-S1 ALIF.  Overall looks good this morning.  Appropriate postop incisional tenderness.  Left DP palpable.  Returning to the OR today for posterior instrumentation.  Vascular is available as needed.  [name] 08/14/2023 6:29 AM --

## 2023-08-14 NOTE — Plan of Care (Signed)

## 2023-08-14 NOTE — Progress Notes (Addendum)
    Subjective: Procedure(s) (LRB): ANTERIOR LUMBAR INTERBODY FUSION LUMBAR FOUR TO SACRAL ONE (N/A) ABDOMINAL EXPOSURE FOR ANTERIOR LUMBAR SPINE SURGERY (N/A) 1 Day Post-Op  Patient reports pain as moderate.  Reports decreased leg pain reports incisional abdominal pain from anterior approach  Positive void, patient has foley catheter still in place, urine output is clear and yellow in color Negative bowel movement Negative flatus Negative chest pain or shortness of breath  Objective: Vital signs in last 24 hours: Temp:  [97.5 F (36.4 C)-98.8 F (37.1 C)] 98 F (36.7 C) (04/15 0723) Pulse Rate:  [86-100] 86 (04/15 0723) Resp:  [9-20] 18 (04/15 0723) BP: (99-133)/(63-88) 122/85 (04/15 0723) SpO2:  [89 %-99 %] 96 % (04/15 0723)  Intake/Output from previous day: 04/14 0701 - 04/15 0700 In: 3490 [P.O.:1440; I.V.:1300; IV Piggyback:750] Out: 4100 [Urine:3900; Blood:200]  Labs: No results for input(s): "WBC", "RBC", "HCT", "PLT" in the last 72 hours. No results for input(s): "NA", "K", "CL", "CO2", "BUN", "CREATININE", "GLUCOSE", "CALCIUM" in the last 72 hours. No results for input(s): "LABPT", "INR" in the last 72 hours.  Physical Exam: Neurologically intact ABD soft Neurovascular intact Sensation intact distally Intact pulses distally Dorsiflexion/Plantar flexion intact Incision: dressing C/D/I Compartment soft Body mass index is 33.41 kg/m.   Assessment/Plan: Assessment: [name] is a very pleasant 63 year old male who is postop day 1 from ALIF L4-L5 and L5-S1.  Overall the surgical procedure was a success.  So far his hospital course has been uncomplicated.  He will return to the OR today for the PSIF of L4-L5 and L5-S1.  Patient has Foley catheter still in place.  He states that his preoperative leg pain is decreased.  He does report some moderate incisional abdominal pain from the anterior approach.  However the dressing is clean dry and intact.  He has not passed any  flatus or had a bowel movement at this time.  Physical exam is unremarkable.  Overall, [name] is doing well and is stable.  Plan: - Continue incentive spirometry -Continue n.p.o. status until part 2 of procedure today -Patient is scheduled for posterior fusion today with Dr. Vaughn Georges - Continue mobilization with physical therapy   Amber Ardelle Kos PA-C [facility] [phone]   Patient reports improvement in his baseline back and neuropathic leg pain.  Primary issue is surgical site pain.  Given the fact that he has less neuropathic pain we will plan on posterior supplemental pedicle screw fixation with posterior lateral arthrodesis.  I do not think additional decompression surgery is required.  I have explained this to the patient and his 2 daughters who are present and they have agreed with the plan.

## 2023-08-14 NOTE — Anesthesia Procedure Notes (Addendum)
 Procedure Name: Intubation Date/Time: 08/14/2023 1:17 PM  Performed by: Dawna Etienne, CRNAPre-anesthesia Checklist: Patient identified, Patient being monitored, Timeout performed, Emergency Drugs available and Suction available Patient Re-evaluated:Patient Re-evaluated prior to induction Oxygen Delivery Method: Circle system utilized Preoxygenation: Pre-oxygenation with 100% oxygen Induction Type: IV induction Ventilation: Mask ventilation without difficulty and Oral airway inserted - appropriate to patient size Laryngoscope Size: Mac, 4 and Glidescope Grade View: Grade I Tube type: Oral Tube size: 7.5 mm Number of attempts: 1 Airway Equipment and Method: Rigid stylet and Video-laryngoscopy Placement Confirmation: ETT inserted through vocal cords under direct vision, positive ETCO2 and breath sounds checked- equal and bilateral Secured at: 22 cm Tube secured with: Tape Dental Injury: Teeth and Oropharynx as per pre-operative assessment

## 2023-08-14 NOTE — Evaluation (Signed)
 Physical Therapy Evaluation Patient Details Name: [name] MRN: [id number] DOB: 07/19/1960 Today's Date: 08/14/2023  History of Present Illness  Pt is a 63 y/o male who presents s/p L4-S1 ALIF on 08/13/2023. Second stage of surgery to be performed 08/14/2023 -L4-S1 PLIF planned.PMH significant for HTN, GERD, achalasia.   Clinical Impression  Pt admitted with above diagnosis. At the time of PT eval, pt was able to demonstrate transfers and ambulation with gross CGA to min assist and RW for support. Pt was educated on precautions, appropriate activity progression, and car transfer. Verbally reviewed brace application and wearing schedule as pt did not have brace present at time of eval. Pt currently with functional limitations due to the deficits listed below (see PT Problem List). Pt will benefit from skilled PT to increase their independence and safety with mobility to allow discharge to the venue listed below.          If plan is discharge home, recommend the following: A little help with walking and/or transfers;A little help with bathing/dressing/bathroom;Assistance with cooking/housework;Assist for transportation;Help with stairs or ramp for entrance   Can travel by private vehicle        Equipment Recommendations Rolling walker (2 wheels)  Recommendations for Other Services       Functional Status Assessment Patient has had a recent decline in their functional status and demonstrates the ability to make significant improvements in function in a reasonable and predictable amount of time.     Precautions / Restrictions Precautions Precautions: Fall Recall of Precautions/Restrictions: Intact Required Braces or Orthoses: Spinal Brace Spinal Brace: Lumbar corset;Applied in sitting position (Not present at hospital) Restrictions Weight Bearing Restrictions Per Provider Order: No      Mobility  Bed Mobility Overal bed mobility: Needs Assistance Bed Mobility: Rolling, Sidelying to  Sit Rolling: Supervision Sidelying to sit: Min assist       General bed mobility comments: Assist for trunk elevation to full sitting position.    Transfers Overall transfer level: Needs assistance Equipment used: Rolling walker (2 wheels) Transfers: Sit to/from Stand Sit to Stand: Contact guard assist           General transfer comment: Hands on guarding for safety as pt powered up to full stand. VC's for hand placement on seated surface for safety.    Ambulation/Gait Ambulation/Gait assistance: Contact guard assist Gait Distance (Feet): 175 Feet Assistive device: Rolling walker (2 wheels) Gait Pattern/deviations: Step-through pattern, Decreased stride length, Trunk flexed Gait velocity: Decreased Gait velocity interpretation: 1.31 - 2.62 ft/sec, indicative of limited community ambulator   General Gait Details: Slow and guarded with low floor clearance. No overt LOB noted. Pt endorses pain.  Stairs Stairs: Yes Stairs assistance: Contact guard assist Stair Management: One rail Left, Step to pattern, Forwards Number of Stairs: 3 General stair comments: VC's for sequencing and safety.  Wheelchair Mobility     Tilt Bed    Modified Rankin (Stroke Patients Only)       Balance Overall balance assessment: Mild deficits observed, not formally tested                                           Pertinent Vitals/Pain Pain Assessment Pain Assessment: Faces Faces Pain Scale: Hurts even more Pain Location: Incision site - abdomen Pain Descriptors / Indicators: Operative site guarding, Sore Pain Intervention(s): Limited activity within patient's tolerance, Monitored during session, Repositioned  Home Living Family/patient expects to be discharged to:: Private residence Living Arrangements: Spouse/significant other Available Help at Discharge: Family;Available 24 hours/day Type of Home: House Home Access: Stairs to enter Entrance Stairs-Rails:  None Entrance Stairs-Number of Steps: 1-3?   Home Layout: One level Home Equipment: Cane - single point;Shower seat - built in;BSC/3in1;Grab bars - tub/shower      Prior Function Prior Level of Function : Other (comment);Independent/Modified Independent (Not able to work)             Mobility Comments: No AD       Extremity/Trunk Assessment   Upper Extremity Assessment Upper Extremity Assessment: Defer to OT evaluation    Lower Extremity Assessment Lower Extremity Assessment: Generalized weakness    Cervical / Trunk Assessment Cervical / Trunk Assessment: Back Surgery  Communication   Communication Communication: Impaired Factors Affecting Communication: Other (comment) (English speaking, however English not 1st language and occasional language barrier)    Cognition Arousal: Alert Behavior During Therapy: WFL for tasks assessed/performed   PT - Cognitive impairments: No apparent impairments                         Following commands: Intact       Cueing Cueing Techniques: Verbal cues, Gestural cues     General Comments      Exercises     Assessment/Plan    PT Assessment Patient needs continued PT services  PT Problem List Decreased strength;Decreased balance;Decreased activity tolerance;Decreased mobility;Decreased knowledge of use of DME;Decreased safety awareness;Decreased knowledge of precautions;Pain       PT Treatment Interventions DME instruction;Gait training;Stair training;Functional mobility training;Therapeutic activities;Therapeutic exercise;Balance training;Patient/family education    PT Goals (Current goals can be found in the Care Plan section)  Acute Rehab PT Goals Patient Stated Goal: Home tomorrow, decrease pain PT Goal Formulation: With patient Time For Goal Achievement: 08/21/23 Potential to Achieve Goals: Good    Frequency Min 5X/week     Co-evaluation               AM-PAC PT "6 Clicks" Mobility  Outcome  Measure Help needed turning from your back to your side while in a flat bed without using bedrails?: A Little Help needed moving from lying on your back to sitting on the side of a flat bed without using bedrails?: A Little Help needed moving to and from a bed to a chair (including a wheelchair)?: A Little Help needed standing up from a chair using your arms (e.g., wheelchair or bedside chair)?: A Little Help needed to walk in hospital room?: A Little Help needed climbing 3-5 steps with a railing? : A Little 6 Click Score: 18    End of Session Equipment Utilized During Treatment: Gait belt Activity Tolerance: Patient limited by pain Patient left: in bed;with call bell/phone within reach;Other (comment) (OT present) Nurse Communication: Mobility status PT Visit Diagnosis: Unsteadiness on feet (R26.81);Pain Pain - Right/Left: Left Pain - part of body:  (abdomen)    Time: 4098-1191 PT Time Calculation (min) (ACUTE ONLY): 26 min   Charges:   PT Evaluation $PT Eval Low Complexity: 1 Low PT Treatments $Gait Training: 8-22 mins PT General Charges $$ ACUTE PT VISIT: 1 Visit         Conni Slipper, PT, DPT Acute Rehabilitation Services Secure Chat Preferred Office: [phone]   Marylynn Pearson 08/14/2023, 9:17 AM

## 2023-08-14 NOTE — Op Note (Signed)
 OPERATIVE REPORT  DATE OF SURGERY: 08/14/2023  PATIENT NAME:  [name] MRN: [id number] DOB: 1961/04/01  PCP: Patient, No Pcp Per  PRE-OPERATIVE DIAGNOSIS:  Degenerative lumbar disc disease with disc back buttock and radicular leg pain L4-S1.  Status post anterior lumbar interbody fusion L4-S1.  POST-OPERATIVE DIAGNOSIS: Same  PROCEDURE:   Posterior supplemental pedicle screw fixation L4-S1 with posterior lateral arthrodesis  SURGEON:  Mort Ards, MD  PHYSICIAN ASSISTANT: Letty Raya  ANESTHESIA:   General  EBL: 100 ml   Complications: none  Implants: NuVasive cortical pedicle screws.  5.5 x 40 mm length and L4 and 5.  6.5 x 35 mm at  S1.  Graft: DBX mix along with decortication of L4-5 L5-S1 facet joints and transverse processes.  Monitoring: All 6 Screws were directly stimulated.  On the right side there was no activity greater than 20 mA that each level.  Left side: L4: Positive activity at 10 mA.  L5: Positive activity at 15 mA.  S1: Positive activity at 17 mA.  There is no adverse regarding EMG activity.  BRIEF HISTORY: [name] is a 63 y.o. male who has had significant back buttock and neuropathic leg pain for some time now.  Yesterday he underwent a two-level anterior interbody fusion.  This morning he notes improvement in the neuropathic leg pain and his back pain.  His primary complaint is incisional abdominal pain.  Given the improvement with the indirect decompression we elected to move forward with just supplemental fixation.  Risks benefits and alternatives were discussed and consent was obtained.  PROCEDURE DETAILS: Patient was brought into the operating room and was properly positioned on the operating room table.  After induction with general anesthesia the patient was endotracheally intubated.  A timeout was taken to confirm all important data: including patient, procedure, and the level. Teds, SCD's were applied.   Patient was turned prone onto the spine  frame.  The back was prepped and draped in a standard fashion.  Imaging was used to identify the L4, L5, and S1 pedicles and they were marked on the skin surface.  I then marked out my incision site and infiltrated with quarter percent Marcaine with epinephrine.  Midline incision was made and sharp dissection was carried out down to the deep fascia.  The deep fascia was sharply incised and stripped the paraspinal muscles to expose the L4, L5, and S1 spinous process and lamina.  I then identified the L3-4, L4-5, and L5-S1 facet complexes.  I decorticated the L4-5 and L5-S1 facet joints with Bovie.  Using fluoroscopy I placed my high-speed bur on the inferior medial portion of the pedicle of L4.  I confirmed positioning with fluoroscopy and approach the cortex.  The pedicle awl was then placed and advanced using fluoroscopic guidance towards the superior lateral corner of the pedicle.  As I passed the midportion of the pedicle in the AP view I confirmed satisfactory positioning in the lateral view.  Once I was at the posterior wall of the vertebral body I confirmed that it was still within the substance of the pedicle on the AP view.  I then advanced into the vertebral body.  I removed the pedicle awl and then palpated the whole with a ball-tipped feeler.  Once I confirm the integrity of the tract I then tapped and repalpated with a ball-tipped feeler.  The 5.0 x 40 mm screw was then placed and was noted to have excellent purchase.  Using this exact same technique I placed  contralateral L4 screw as well as the L5 screw.  S1 I placed my high-speed bur more with direct medial corner of the pedicle and broached the cortex.  I advanced towards the lateral side.  Trajectory and positioning was confirmed with biplane fluoroscopy.  I again palpated the tract with a ball-tipped feeler to ensure that its integrity and then tapped.  I then rechecked and confirmed the integrity of the hold.  The 6.5 x 35 mm length screw was  placed.  All 6 pedicle screws had excellent purchase.  They were directly stimulated and there was no adverse activity to suggest breach of the cortex and nerve irritation.  Once all 6 screws were properly positioned and stimulated I irrigated the wound copiously with normal saline.  Using a high-speed bur I decorticated the L4-5 and L5-S1 facet complexes as well as the sacral ala and the L5 and L4 transverse processes.  The bone material from the burring was left in place.  Polyaxial heads were then secured to each of the screws and I placed a 65 mm rod.  Locking caps were then inserted and secured and then final tightened according manufacture standards.  The DBX bone mix was then placed in the posterior lateral gutter and into the facet complexes at L4-5 and L5-S1 to facilitate the posterior lateral arthrodesis.  Wound was then irrigated and hemostasis was confirmed with bipolar electrocautery.  The deep fascia was closed with a running #1 strata fix followed by a running 0 Vicryl suture.  Superficial tissue was closed with interrupted 2-0 Vicryl sutures and finally a 3-0 Monocryl for the skin.  Steri-Strips and a dry dressing were applied and the patient was ultimately extubated and transferred to the PACU without incident.  The end the case all needle and sponge counts were correct.  There were no adverse intraoperative  Mort Ards, MD 08/14/2023 3:33 PM

## 2023-08-14 NOTE — Evaluation (Signed)
 Occupational Therapy Evaluation Patient Details Name: [name] MRN: [id number] DOB: 11-04-1960 Today's Date: 08/14/2023   History of Present Illness   Pt is a 63 y/o male who presents s/p L4-S1 ALIF on 08/13/2023. Second stage of surgery to be performed 08/14/2023 -L4-S1 PLIF planned.PMH significant for     Clinical Impressions Patient admitted for the diagnosis and procedure above.  PTA he lives at home with his family, and will have the needed assist at home.  Currently he is awaiting the second half of his procedure.  Patient's primary deficit is pain, OT reviewed hip kit and ADL technique for eventual performance prior to discharge.  Patient needing Min A for bed mobility and supervision for walking at RW level.  Currently Mod A for ADL completion.  OT will continue to follow in the acute setting to ensure compliance with precautions.  No post acute OT is anticipated.       If plan is discharge home, recommend the following:   Assist for transportation;A little help with bathing/dressing/bathroom;A little help with walking and/or transfers     Functional Status Assessment   Patient has had a recent decline in their functional status and demonstrates the ability to make significant improvements in function in a reasonable and predictable amount of time.     Equipment Recommendations   None recommended by OT     Recommendations for Other Services         Precautions/Restrictions   Precautions Precautions: Fall Recall of Precautions/Restrictions: Intact Required Braces or Orthoses: Spinal Brace Spinal Brace: Lumbar corset;Applied in sitting position Restrictions Weight Bearing Restrictions Per Provider Order: No     Mobility Bed Mobility Overal bed mobility: Needs Assistance Bed Mobility: Sit to Supine       Sit to supine: Min assist        Transfers Overall transfer level: Needs assistance Equipment used: Rolling walker (2 wheels) Transfers: Sit  to/from Stand Sit to Stand: Contact guard assist                  Balance Overall balance assessment: Needs assistance Sitting-balance support: Feet supported Sitting balance-Leahy Scale: Good     Standing balance support: Reliant on assistive device for balance Standing balance-Leahy Scale: Fair                             ADL either performed or assessed with clinical judgement   ADL                       Lower Body Dressing: Moderate assistance   Toilet Transfer: Minimal assistance                   Vision Patient Visual Report: No change from baseline       Perception Perception: Not tested       Praxis Praxis: Not tested       Pertinent Vitals/Pain Pain Assessment Pain Assessment: Faces Faces Pain Scale: Hurts even more Pain Location: Incision site - abdomen Pain Descriptors / Indicators: Operative site guarding, Sore Pain Intervention(s): Monitored during session     Extremity/Trunk Assessment Upper Extremity Assessment Upper Extremity Assessment: Overall WFL for tasks assessed   Lower Extremity Assessment Lower Extremity Assessment: Defer to PT evaluation   Cervical / Trunk Assessment Cervical / Trunk Assessment: Back Surgery   Communication Communication Communication: Impaired Factors Affecting Communication: Other (comment)   Cognition Arousal: Alert Behavior During  Therapy: WFL for tasks assessed/performed Cognition: No apparent impairments                               Following commands: Intact       Cueing  General Comments   Cueing Techniques: Verbal cues;Gestural cues      Exercises     Shoulder Instructions      Home Living Family/patient expects to be discharged to:: Private residence Living Arrangements: Spouse/significant other Available Help at Discharge: Family;Available 24 hours/day Type of Home: House Home Access: Stairs to enter [employer] of Steps:  1-3? Entrance Stairs-Rails: None Home Layout: One level     Bathroom Shower/Tub: Producer, television/film/video: Handicapped height Bathroom Accessibility: Yes How Accessible: Accessible via walker Home Equipment: Cane - single point;Shower seat - built in;BSC/3in1;Grab bars - tub/shower          Prior Functioning/Environment Prior Level of Function : Other (comment);Independent/Modified Independent             Mobility Comments: No AD ADLs Comments: Ind    OT Problem List: Pain   OT Treatment/Interventions: Self-care/ADL training;Therapeutic activities;DME and/or AE instruction;Patient/family education;Balance training      OT Goals(Current goals can be found in the care plan section)   Acute Rehab OT Goals Patient Stated Goal: Return home OT Goal Formulation: With patient Time For Goal Achievement: 08/28/23 Potential to Achieve Goals: Good ADL Goals Pt Will Perform Lower Body Dressing: with modified independence;sit to/from stand Pt Will Transfer to Toilet: with modified independence;ambulating;regular height toilet   OT Frequency:  Min 2X/week    Co-evaluation              AM-PAC OT "6 Clicks" Daily Activity     Outcome Measure Help from another person eating meals?: None Help from another person taking care of personal grooming?: A Little Help from another person toileting, which includes using toliet, bedpan, or urinal?: A Little Help from another person bathing (including washing, rinsing, drying)?: A Lot Help from another person to put on and taking off regular upper body clothing?: A Little Help from another person to put on and taking off regular lower body clothing?: A Lot 6 Click Score: 17   End of Session Equipment Utilized During Treatment: Back brace Nurse Communication: Mobility status  Activity Tolerance: Patient limited by pain Patient left: in bed;with family/visitor present  OT Visit Diagnosis: Pain                Time:  0630-1601 OT Time Calculation (min): 20 min Charges:  OT General Charges $OT Visit: 1 Visit OT Evaluation $OT Eval Moderate Complexity: 1 Mod  08/14/2023  RP, OTR/L  Acute Rehabilitation Services  Office:  [phone]   Benjamen Brand 08/14/2023, 11:03 AM

## 2023-08-14 NOTE — Anesthesia Preprocedure Evaluation (Signed)
 Anesthesia Evaluation  Patient identified by MRN, date of birth, ID band Patient awake    Reviewed: Allergy & Precautions, NPO status , Patient's Chart, lab work & pertinent test results  History of Anesthesia Complications (+) PONV and history of anesthetic complications  Airway Mallampati: IV  TM Distance: >3 FB Neck ROM: Full    Dental  (+) Dental Advisory Given, Teeth Intact,    Pulmonary neg pulmonary ROS   breath sounds clear to auscultation       Cardiovascular hypertension, Pt. on medications (-) dysrhythmias  Rhythm:Regular     Neuro/Psych  PSYCHIATRIC DISORDERS  Depression    negative neurological ROS     GI/Hepatic Neg liver ROS,GERD  Medicated and Controlled,,  Endo/Other  negative endocrine ROS    Renal/GU Renal InsufficiencyRenal diseaseLab Results      Component                Value               Date                      NA                       138                 07/30/2023                K                        4.1                 07/30/2023                CO2                      28                  07/30/2023                GLUCOSE                  109 (H)             07/30/2023                BUN                      22                  07/30/2023                CREATININE               1.30 (H)            07/30/2023                CALCIUM                  9.6                 07/30/2023                GFRNONAA                 >60                 07/30/2023  Musculoskeletal negative musculoskeletal ROS (+)    Abdominal Normal abdominal exam  (+)   Peds  Hematology negative hematology ROS (+) Lab Results      Component                Value               Date                      WBC                      8.0                 07/30/2023                HGB                      16.3                07/30/2023                HCT                      47.6                07/30/2023                 MCV                      91.4                07/30/2023                PLT                      286                 07/30/2023              Anesthesia Other Findings   Reproductive/Obstetrics                             Anesthesia Physical Anesthesia Plan  ASA: 3  Anesthesia Plan: General   Post-op Pain Management: Ofirmev IV (intra-op)*   Induction: Intravenous  PONV Risk Score and Plan: 4 or greater and Ondansetron, Dexamethasone, Midazolam and Treatment may vary due to age or medical condition  Airway Management Planned: Oral ETT, Video Laryngoscope Planned and Mask  Additional Equipment: None  Intra-op Plan:   Post-operative Plan: Extubation in OR  Informed Consent: I have reviewed the patients History and Physical, chart, labs and discussed the procedure including the risks, benefits and alternatives for the proposed anesthesia with the patient or authorized representative who has indicated his/her understanding and acceptance.     Dental advisory given  Plan Discussed with: CRNA  Anesthesia Plan Comments: (PAT note written 08/10/2023 by Allison Zelenak, PA-C.  )       Anesthesia Quick Evaluation

## 2023-08-14 NOTE — Brief Op Note (Signed)
 08/13/2023 - 08/14/2023  3:44 PM  PATIENT:  [name]  63 y.o. male  PRE-OPERATIVE DIAGNOSIS:  Degenerative disc disease with discogenic lumbar back pain and radicular leg pain  POST-OPERATIVE DIAGNOSIS:  Degenerative disc disease with discogenic lumbar back pain and radicular leg pain  PROCEDURE:  Procedure(s) with comments: POSTERIOR LUMBAR FUSION 2 LEVEL (N/A) - Posterior spinal fusion interbody L4-S1  SURGEON:  Surgeons and Role:    Mort Ards, MD - Primary  PHYSICIAN ASSISTANT: Letty Raya, PA  ANESTHESIA:   general  EBL:  100    BLOOD ADMINISTERED:none  DRAINS: none   LOCAL MEDICATIONS USED:  MARCAINE     SPECIMEN:  No Specimen  DISPOSITION OF SPECIMEN:  PATHOLOGY  COUNTS:  YES  TOURNIQUET:  * No tourniquets in log *  DICTATION: .Dragon Dictation  PLAN OF CARE: Admit to inpatient   PATIENT DISPOSITION:  PACU - hemodynamically stable.

## 2023-08-15 ENCOUNTER — Inpatient Hospital Stay (HOSPITAL_COMMUNITY)

## 2023-08-15 DIAGNOSIS — M7989 Other specified soft tissue disorders: Secondary | ICD-10-CM

## 2023-08-15 LAB — POCT I-STAT, CHEM 8
BUN: 14 mg/dL (ref 8–23)
Calcium, Ion: 0.99 mmol/L — ABNORMAL LOW (ref 1.15–1.40)
Chloride: 104 mmol/L (ref 98–111)
Creatinine, Ser: 1 mg/dL (ref 0.61–1.24)
Glucose, Bld: 113 mg/dL — ABNORMAL HIGH (ref 70–99)
HCT: 36 % — ABNORMAL LOW (ref 39.0–52.0)
Hemoglobin: 12.2 g/dL — ABNORMAL LOW (ref 13.0–17.0)
Potassium: 3.5 mmol/L (ref 3.5–5.1)
Sodium: 138 mmol/L (ref 135–145)
TCO2: 23 mmol/L (ref 22–32)

## 2023-08-15 MED ORDER — PHENYLEPHRINE HCL 1 % NA SOLN
1.0000 [drp] | Freq: Four times a day (QID) | NASAL | Status: DC | PRN
  Filled 2023-08-15: qty 15

## 2023-08-15 MED FILL — Heparin Sodium (Porcine) Inj 1000 Unit/ML: INTRAMUSCULAR | Qty: 30 | Status: AC

## 2023-08-15 NOTE — Progress Notes (Signed)
 Physical Therapy Treatment  Patient Details Name: [name] MRN: [id number] DOB: March 17, 1961 Today's Date: 08/15/2023   History of Present Illness Pt is a 63 y/o male who presents s/p L4-S1 ALIF on 08/13/2023. Second stage of surgery to be performed 08/14/2023 -L4-S1 PLIF planned.PMH significant for HTN, GERD, achalasia.     PT Comments  Pt progressing well with post-op mobility. He was able to demonstrate transfers and ambulation with up to mod assist and RW for support. Pt endorses increased pain through LLE throughout session and at start of session states LLE is numb. Reinforced education on precautions, brace application/wearing schedule, appropriate activity progression, and car transfer. Will continue to follow.      If plan is discharge home, recommend the following: A little help with walking and/or transfers;A little help with bathing/dressing/bathroom;Assistance with cooking/housework;Assist for transportation;Help with stairs or ramp for entrance   Can travel by private vehicle        Equipment Recommendations  Rolling walker (2 wheels)    Recommendations for Other Services       Precautions / Restrictions Precautions Precautions: Fall Recall of Precautions/Restrictions: Intact Precaution/Restrictions Comments: Reviewed handout and pt was cued for precautions during functional mobility. Required Braces or Orthoses: Spinal Brace Spinal Brace: Lumbar corset;Applied in sitting position Restrictions Weight Bearing Restrictions Per Provider Order: No     Mobility  Bed Mobility               General bed mobility comments: Pt was received sitting up in the recliner    Transfers Overall transfer level: Needs assistance Equipment used: Rolling walker (2 wheels) Transfers: Sit to/from Stand Sit to Stand: Mod assist           General transfer comment: Pt moving slow and guarded due to pain when preparing to stand. Mod assist required for power up and to  gain/maintain standing balance. VC's for hand placement on seated surface for safety.    Ambulation/Gait Ambulation/Gait assistance: Contact guard assist Gait Distance (Feet): 125 Feet Assistive device: Rolling walker (2 wheels) Gait Pattern/deviations: Step-through pattern, Decreased stride length, Trunk flexed Gait velocity: Decreased Gait velocity interpretation: 1.31 - 2.62 ft/sec, indicative of limited community ambulator   General Gait Details: VC's throughout for improved posture, closer walker proximity and forward gaze. Slow and guarded with low floor clearance. No overt LOB noted. Pt endorses pain down LLE.   Stairs             Wheelchair Mobility     Tilt Bed    Modified Rankin (Stroke Patients Only)       Balance Overall balance assessment: Needs assistance Sitting-balance support: Feet supported Sitting balance-Leahy Scale: Good     Standing balance support: Reliant on assistive device for balance Standing balance-Leahy Scale: Fair                              Hotel manager: Impaired Factors Affecting Communication: Other (comment)  Cognition Arousal: Alert Behavior During Therapy: WFL for tasks assessed/performed   PT - Cognitive impairments: No apparent impairments                         Following commands: Intact      Cueing Cueing Techniques: Verbal cues, Gestural cues  Exercises      General Comments General comments (skin integrity, edema, etc.): Pt reports dizziness upon standing and during ambulation. BP stable at 138/92 upon  first stand and 147/92 after gait training.      Pertinent Vitals/Pain Pain Assessment Pain Assessment: Faces Faces Pain Scale: Hurts even more Pain Location: Incision site - abdomen Pain Descriptors / Indicators: Operative site guarding, Sore Pain Intervention(s): Limited activity within patient's tolerance, Monitored during session, Repositioned     Home Living Family/patient expects to be discharged to:: Private residence Living Arrangements: Spouse/significant other Available Help at Discharge: Family;Available 24 hours/day Type of Home: House Home Access: Stairs to enter Entrance Stairs-Rails: None Entrance Stairs-Number of Steps: 1-3?   Home Layout: One level Home Equipment: Cane - single point;Shower seat - built in;BSC/3in1;Grab bars - tub/shower      Prior Function            PT Goals (current goals can now be found in the care plan section) Acute Rehab PT Goals Patient Stated Goal: Home Friday, decrease pain PT Goal Formulation: With patient Time For Goal Achievement: 08/21/23 Potential to Achieve Goals: Good Progress towards PT goals: Progressing toward goals    Frequency    Min 5X/week      PT Plan      Co-evaluation              AM-PAC PT "6 Clicks" Mobility   Outcome Measure  Help needed turning from your back to your side while in a flat bed without using bedrails?: A Little Help needed moving from lying on your back to sitting on the side of a flat bed without using bedrails?: A Little Help needed moving to and from a bed to a chair (including a wheelchair)?: A Little Help needed standing up from a chair using your arms (e.g., wheelchair or bedside chair)?: A Little Help needed to walk in hospital room?: A Little Help needed climbing 3-5 steps with a railing? : A Little 6 Click Score: 18    End of Session Equipment Utilized During Treatment: Gait belt Activity Tolerance: Patient limited by pain Patient left: in chair;with call bell/phone within reach;with family/visitor present Nurse Communication: Mobility status PT Visit Diagnosis: Unsteadiness on feet (R26.81);Pain Pain - Right/Left: Left Pain - part of body:  (abdomen)     Time: 1610-9604 PT Time Calculation (min) (ACUTE ONLY): 27 min  Charges:    $Gait Training: 23-37 mins PT General Charges $$ ACUTE PT VISIT: 1  Visit                     Simone Dubois, PT, DPT Acute Rehabilitation Services Secure Chat Preferred Office: [phone]    Venus Ginsberg 08/15/2023, 2:17 PM

## 2023-08-15 NOTE — Progress Notes (Signed)
 Lower extremity venous duplex completed. Please see CV Procedures for preliminary results.  Estanislao Heimlich, RVT 08/15/23 3:40 PM

## 2023-08-15 NOTE — Plan of Care (Signed)

## 2023-08-15 NOTE — Progress Notes (Signed)
    Subjective: Procedure(s) (LRB): POSTERIOR LUMBAR FUSION 2 LEVEL (N/A) 1 Day Post-Op  Patient reports pain as severe.  Reports decreased leg pain reports incisional back pain  and incisional abdominal pain  Positive void Negative bowel movement Positive flatus Negative chest pain or shortness of breath  Objective: Vital signs in last 24 hours: Temp:  [97.7 F (36.5 C)-99 F (37.2 C)] 99 F (37.2 C) (04/16 0424) Pulse Rate:  [85-105] 91 (04/16 0424) Resp:  [10-28] 16 (04/16 0424) BP: (122-145)/(78-96) 135/81 (04/16 0424) SpO2:  [90 %-98 %] 94 % (04/16 0424)  Intake/Output from previous day: 04/15 0701 - 04/16 0700 In: 1790 [P.O.:240; I.V.:1000; IV Piggyback:550] Out: 3280 [Urine:3180; Blood:100]  Labs: Recent Labs    08/14/23 1226 08/14/23 1736  WBC  --  13.1*  RBC  --  3.91*  HCT 36.0* 36.5*  PLT  --  196   Recent Labs    08/14/23 1226 08/14/23 1736  NA 138  --   K 3.5  --   CL 104  --   BUN 14  --   CREATININE 1.00 1.22  GLUCOSE 113*  --    No results for input(s): "LABPT", "INR" in the last 72 hours.  Physical Exam: Neurologically intact ABD soft Neurovascular intact Sensation intact distally Intact pulses distally Dorsiflexion/Plantar flexion intact Incision: dressing C/D/I Compartment soft Body mass index is 33.41 kg/m.   Assessment/Plan: Assessment: [name] is a very pleasant 63 year old male who is postop day 2 from ALIF L4-L5 and L5-S1 and POD 1 from PSIF L4-L5 and L5-S1. Both surgical procedures were a success and were without complications. Yesterday evening he did have an episode of left eye irritation with burning, and photophobia. I did prescribe an eye patch and non-antibiotic eye drops as I believed that this was due to eye irritation from anesthesia's tape or a possible corneal abrasion. However, this morning his eye pain is significantly better, he denies any photophobia, and the eye is no longer erythematous. He is stable but does  report incisional back and abdominal pain. He also does state that he is having some congestion.   He does state that he pre-operative leg pain has improved. He was evaluated by Pt yesterday and the plan is for him to continue with PT today. Positive flatus, positive void, negative BM. Dressing's are C/D/I  Physical exam is unremarkable.  Overall, [name] is doing well and is stable.   Plan: - Continue incentive spirometry - Continue mobilization with physical therapy -D/c eye drops  -continue medical pain management  -Doppler US  bilateral LE today for routine DVT screening      [employer] [facility] [phone]

## 2023-08-15 NOTE — TOC Initial Note (Signed)
 Transition of Care [facility]) - Initial/Assessment Note    Patient Details  Name: [name] MRN: [id number] Date of Birth: 1960/10/22  Transition of Care [facility]) CM/SW Contact:    Omie Bickers, RN Phone Number: 08/15/2023, 9:56 AM  Clinical Narrative:                  Eldora Greet w patient at bedside. He states that his workers comp privider is [employer] [phone], VM directs to [facility] [phone]. I spoke w Pam and requested RW to be delivered to the room, per patient request.    Barriers to Discharge: Continued Medical Work up   Patient Goals and CMS Choice Patient states their goals for this hospitalization and ongoing recovery are:: to go home          Expected Discharge Plan and Services                         DME Arranged: Walker rolling       Representative spoke with at DME Agency: Workers Comp            Prior Living Arrangements/Services                       Activities of Daily Living      Permission Sought/Granted                  Emotional Assessment              Admission diagnosis:  Degeneration of intervertebral disc of lumbar region with discogenic back pain and lower extremity pain [M51.362] S/P lumbar fusion [Z98.1] Patient Active Problem List   Diagnosis Date Noted   S/P lumbar fusion 08/13/2023   Chronic back pain 07/24/2023   MDD (major depressive disorder), severe (HCC) 05/03/2017   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   Suicide attempt (HCC) 04/30/2017   Hypotension due to medication 04/30/2017   AKI (acute kidney injury) (HCC) 04/30/2017   Essential hypertension 08/22/2016   Chest pain 08/22/2016   PCP:  Patient, No Pcp Per Pharmacy:   [id number] Jonette Nestle, [place] - [address] AT [facility] [address] [place] [address] Phone: [phone] Fax: [phone]     Social Drivers of Health (SDOH) Social  History: SDOH Screenings   Food Insecurity: No Food Insecurity (08/14/2023)  Housing: Unknown (08/14/2023)  Transportation Needs: No Transportation Needs (08/14/2023)  Utilities: Not At Risk (08/14/2023)  Alcohol Screen: Low Risk  (05/03/2017)  Social Connections: Unknown (09/12/2021)   Received from [facility]  Tobacco Use: Low Risk  (08/14/2023)   SDOH Interventions:     Readmission Risk Interventions     No data to display

## 2023-08-16 MED ORDER — FLEET ENEMA RE ENEM
1.0000 | ENEMA | Freq: Once | RECTAL | Status: AC
  Administered 2023-08-16: 1 via RECTAL
  Filled 2023-08-16: qty 1

## 2023-08-16 MED ORDER — MAGNESIUM CITRATE PO SOLN
0.5000 | Freq: Once | ORAL | Status: DC
  Filled 2023-08-16: qty 296

## 2023-08-16 MED ORDER — DIAZEPAM 5 MG PO TABS
10.0000 mg | ORAL_TABLET | Freq: Three times a day (TID) | ORAL | Status: DC | PRN
  Administered 2023-08-16 – 2023-08-22 (×7): 10 mg via ORAL
  Filled 2023-08-16 (×7): qty 2

## 2023-08-16 NOTE — Progress Notes (Signed)
 Occupational Therapy Treatment Patient Details Name: [name] MRN: [id number] DOB: [birth year] Today's Date: 08/16/2023   History of present illness Pt is a 63 y/o male who presents s/p L4-S1 ALIF on 08/13/2023. Second stage of surgery to be performed 08/14/2023 -L4-S1 PLIF planned.PMH significant for  HTN, GERD, achalasia.   OT comments  Pt progressing toward goals. Primarily limited by scrotal pain. Discussed with nsg and charge nurse ordered a scrotal support. Educated pt on positioing and use of ice to help reduce edema. Educated pt on compensatory techniques for ADL tasks while following back precautions. Will follow up tomorrow to educate on use of AE for LB ADL. Family present for session and educated. No OT follow up needed after DC.       If plan is discharge home, recommend the following:  Assist for transportation;A little help with bathing/dressing/bathroom;A little help with walking and/or transfers   Equipment Recommendations  None recommended by OT    Recommendations for Other Services      Precautions / Restrictions Precautions Precautions: Fall Recall of Precautions/Restrictions: Intact Precaution/Restrictions Comments: handout/precuations reviewed with family Required Braces or Orthoses: Spinal Brace Spinal Brace: Lumbar corset;Applied in sitting position       Mobility Bed Mobility                    Transfers Overall transfer level: Needs assistance Equipment used: Rolling walker (2 wheels) Transfers: Sit to/from Stand Sit to Stand: Contact guard assist                 Balance                                           ADL either performed or assessed with clinical judgement   ADL Overall ADL's : Needs assistance/impaired                                     Functional mobility during ADLs: Contact guard assist General ADL Comments: unable to complete figure four positioning; educated on precuations  durin gADL tasks; using toilet with S and AS for clothing management however edematous scrotum very painful. will benefit from the use of AE as pt had diffiuclty with LB ADL prior to surgery    Extremity/Trunk Assessment              Vision       Perception     Praxis     Communication Communication Communication: Impaired Factors Affecting Communication: Other (comment)   Cognition                                              Cueing      Exercises      Shoulder Instructions       General Comments Educated on positioning  to reduce scuce scrotal edema    Pertinent Vitals/ Pain       Pain Assessment Pain Assessment: Faces Faces Pain Scale: Hurts even more Pain Location: scrotum Pain Descriptors / Indicators: Discomfort Pain Intervention(s): Limited activity within patient's tolerance, Other (comment), Repositioned, Ice applied  Home Living  Prior Functioning/Environment              Frequency  Min 2X/week        Progress Toward Goals  OT Goals(current goals can now be found in the care plan section)  Progress towards OT goals: Progressing toward goals  Acute Rehab OT Goals Patient Stated Goal: reduce pain OT Goal Formulation: With patient Time For Goal Achievement: 08/28/23 Potential to Achieve Goals: Good ADL Goals Pt Will Perform Lower Body Dressing: with modified independence;sit to/from stand Pt Will Transfer to Toilet: with modified independence;ambulating;regular height toilet  Plan      Co-evaluation                 AM-PAC OT "6 Clicks" Daily Activity     Outcome Measure   Help from another person eating meals?: None Help from another person taking care of personal grooming?: A Little Help from another person toileting, which includes using toliet, bedpan, or urinal?: A Little Help from another person bathing (including washing, rinsing,  drying)?: A Lot Help from another person to put on and taking off regular upper body clothing?: A Little Help from another person to put on and taking off regular lower body clothing?: A Lot 6 Click Score: 17    End of Session Equipment Utilized During Treatment: Gait belt;Rolling walker (2 wheels)  OT Visit Diagnosis: Unsteadiness on feet (R26.81);Pain Pain - part of body:  (scrotum)   Activity Tolerance Patient tolerated treatment well   Patient Left in chair;with call bell/phone within reach;with family/visitor present   Nurse Communication Other (comment) (scrotal support ordered)        Time: 1610-9604 OT Time Calculation (min): 28 min  Charges: OT General Charges $OT Visit: 1 Visit OT Treatments $Self Care/Home Management : 23-37 mins  Milburn Aliment, OT/L   Acute OT Clinical Specialist Acute Rehabilitation Services Pager [phone] Office [phone]   [facility] 08/16/2023, 4:44 PM

## 2023-08-16 NOTE — Plan of Care (Signed)

## 2023-08-16 NOTE — Progress Notes (Signed)
 Physical Therapy Treatment Patient Details Name: [name] MRN: [id number] DOB: 01-10-1961 Today's Date: 08/16/2023   History of Present Illness Pt is a 63 y/o male who presents s/p L4-S1 ALIF on 08/13/2023. Second stage of surgery to be performed 08/14/2023 -L4-S1 PLIF planned.PMH significant for  HTN, GERD, achalasia.    PT Comments  Pt seated in recliner.  He is in pain but remains positive and pushed through mobility; He is largely limited due to scrotal swelling and L lateral thigh pain.  He presents with slowed gt speed this session which puts him at a higher risk for falls.  Noted LOB x 1.      If plan is discharge home, recommend the following: A little help with walking and/or transfers;A little help with bathing/dressing/bathroom;Assistance with cooking/housework;Assist for transportation;Help with stairs or ramp for entrance   Can travel by private vehicle        Equipment Recommendations       Recommendations for Other Services       Precautions / Restrictions Precautions Precautions: Fall Recall of Precautions/Restrictions: Intact Precaution/Restrictions Comments: Reviewed handout and pt was cued for precautions during functional mobility. Required Braces or Orthoses: Spinal Brace Spinal Brace: Lumbar corset;Applied in sitting position Restrictions Weight Bearing Restrictions Per Provider Order: No     Mobility  Bed Mobility               General bed mobility comments: Pt was received sitting up in the recliner    Transfers Overall transfer level: Needs assistance Equipment used: Rolling walker (2 wheels) Transfers: Sit to/from Stand Sit to Stand: Mod assist           General transfer comment: Continues to require mod assistance to rise into standing.  Cues for hand placement and forward weight shifting.  Pre-transfer mod assistance to donn LSO corsett    Ambulation/Gait   Gait Distance (Feet): 60 Feet Assistive device: Rolling walker (2  wheels) Gait Pattern/deviations: Step-through pattern, Decreased stride length, Trunk flexed, Wide base of support Gait velocity: Decreased     General Gait Details: Cues for posture and pacing.  He moves in very slow pace due to scrotal swelling and tightness in L lateral thigh.  LOB noted x 1 with stopping to hand pt off to OT.   Stairs             Wheelchair Mobility     Tilt Bed    Modified Rankin (Stroke Patients Only)       Balance Overall balance assessment: Needs assistance Sitting-balance support: Feet supported Sitting balance-Leahy Scale: Good       Standing balance-Leahy Scale: Poor                              Communication Communication Communication: Impaired Factors Affecting Communication: Other (comment)  Cognition Arousal: Alert Behavior During Therapy: WFL for tasks assessed/performed   PT - Cognitive impairments: No apparent impairments                         Following commands: Intact      Cueing Cueing Techniques: Verbal cues, Gestural cues  Exercises      General Comments        Pertinent Vitals/Pain Pain Assessment Pain Assessment: Faces Faces Pain Scale: Hurts even more Pain Location: Incision site - abdomen Pain Descriptors / Indicators: Operative site guarding, Sore Pain Intervention(s): Monitored during session, Repositioned  Home Living                          Prior Function            PT Goals (current goals can now be found in the care plan section) Acute Rehab PT Goals Patient Stated Goal: get swelling down in scrotum Potential to Achieve Goals: Good Progress towards PT goals: Progressing toward goals    Frequency    Min 5X/week      PT Plan      Co-evaluation              AM-PAC PT "6 Clicks" Mobility   Outcome Measure  Help needed turning from your back to your side while in a flat bed without using bedrails?: A Little Help needed moving from lying  on your back to sitting on the side of a flat bed without using bedrails?: A Little Help needed moving to and from a bed to a chair (including a wheelchair)?: A Little Help needed standing up from a chair using your arms (e.g., wheelchair or bedside chair)?: A Lot Help needed to walk in hospital room?: A Lot Help needed climbing 3-5 steps with a railing? : Total 6 Click Score: 14    End of Session Equipment Utilized During Treatment: Gait belt Activity Tolerance: Patient limited by pain Patient left:  (passed to OT in standing as she dovetailed PT session.) Nurse Communication: Mobility status PT Visit Diagnosis: Unsteadiness on feet (R26.81);Pain Pain - Right/Left: Left     Time: 1610-9604 PT Time Calculation (min) (ACUTE ONLY): 16 min  Charges:    $Gait Training: 8-22 mins PT General Charges $$ ACUTE PT VISIT: 1 Visit                    Beulah Brunt , PTA Acute Rehabilitation Services Office [phone]    [name] 08/16/2023, 4:20 PM

## 2023-08-16 NOTE — Progress Notes (Signed)
 Upon assessment, pt genital and scrotum are both swollen and painful to touch. No burning sensation during urination per pt. Ice pack placed on the area. Called on-call team and informed Sharlynn Dear PA and will continue to monitor overnight.

## 2023-08-16 NOTE — Anesthesia Postprocedure Evaluation (Signed)
 Anesthesia Post Note  Patient: [name]  Procedure(s) Performed: POSTERIOR LUMBAR FUSION 2 LEVEL     Patient location during evaluation: PACU Anesthesia Type: General Level of consciousness: awake and alert Pain management: pain level controlled Vital Signs Assessment: post-procedure vital signs reviewed and stable Respiratory status: spontaneous breathing, nonlabored ventilation, respiratory function stable and patient connected to nasal cannula oxygen Cardiovascular status: blood pressure returned to baseline and stable Postop Assessment: no apparent nausea or vomiting Anesthetic complications: no   No notable events documented.  Last Vitals:  Vitals:   08/16/23 0545 08/16/23 0721  BP: (!) 142/83 (!) 136/94  Pulse: 86 99  Resp: 17   Temp: 37.7 C 37.2 C  SpO2: 92% 96%    Last Pain:  Vitals:   08/16/23 1129  TempSrc:   PainSc: 8                  Sukaina Toothaker P Swayzee Wadley

## 2023-08-16 NOTE — Progress Notes (Addendum)
 Subjective: Procedure(s) (LRB): POSTERIOR LUMBAR FUSION 2 LEVEL (N/A) 2 Days Post-Op  Patient reports pain as severe.  Reports decreased leg pain reports incisional back pain  and incisional abdominal pain; states that the incisional abdominal pain is worse than his back incisional pain Positive void Positive bowel movement Positive flatus Negative chest pain or shortness of breath Pain and tenderness of the genitals and scrotum, some erythema and edema noted, this started acutely last night; denies dysuria or hematuria at this time Patient reports that his congestion has improved  Patient reports that his left eye pain has resolved  Objective: Vital signs in last 24 hours: Temp:  [97.5 F (36.4 C)-99.8 F (37.7 C)] 99.8 F (37.7 C) (04/17 0545) Pulse Rate:  [86-99] 86 (04/17 0545) Resp:  [17] 17 (04/17 0545) BP: (125-142)/(83-96) 142/83 (04/17 0545) SpO2:  [92 %-100 %] 92 % (04/17 0545)  Intake/Output from previous day: 04/16 0701 - 04/17 0700 In: 900 [P.O.:900] Out: 700 [Urine:700]  Labs: Recent Labs    08/14/23 1226 08/14/23 1736  WBC  --  13.1*  RBC  --  3.91*  HCT 36.0* 36.5*  PLT  --  196   Recent Labs    08/14/23 1226 08/14/23 1736  NA 138  --   K 3.5  --   CL 104  --   BUN 14  --   CREATININE 1.00 1.22  GLUCOSE 113*  --    No results for input(s): "LABPT", "INR" in the last 72 hours.  Physical Exam: Neurologically intact ABD soft Neurovascular intact Sensation intact distally Intact pulses distally Dorsiflexion/Plantar flexion intact Incision: dressing C/D/I Compartment soft Genitals and scrotum are slightly edematous and erythematous; examined with chaperone RN [name].   Body mass index is 33.41 kg/m.   Assessment/Plan: Assessment: [name] is a very pleasant 63 year old male who is postop day 3 from ALIF L4-L5 and L5-S1 and POD 2 from PSIF L4-L5 and L5-S1. Both surgical procedures were a success and were without complications. Tuesday  evening he did have an episode of left eye irritation with burning, and photophobia. I did prescribe an eye patch and non-antibiotic eye drops as I believed that this was due to eye irritation from anesthesia's tape or a possible corneal abrasion. This issue has since resolved and I have discontinued the eye drops. Last night the patient did have an episode of diaphoresis, chills, and dizziness. Upon further exam the nursing staff did notice erythema and edema of his genitals and scrotum. The patient stated that his genital arrea was painful during that time. I did examine this area today with the help of June RN has my chaperone; the genitals and scrotum were still slightly edematous and erythematous but has improved since last night. Patient denies any hematuria or dysuria at this time. Yesterday I did prescribe a nasal decongestant for his congestion s/sx and he states that this is helping to relieve his symptoms. Doppler DVT US of bilateral LE yesterday was negative for DVT bilaterally. Overall, he is stable but does report incisional back and abdominal pain.    He does state that he pre-operative leg pain has improved. He was evaluated by PT  and did have a session with them yesterday and a rolling walker was recommended.Due to his incisional pain and genital erythema and edema I do believe that it is reasonable for him to stay an additional night.  Plan is for him to continue with PT today. Positive flatus, positive void, positive BM. Dressing's are  C/D/I.   Plan: - Continue incentive spirometry - Continue mobilization with physical therapy -continue medical pain management    Amber Juel Nutley [facility] [phone]   Agree with above Patient with post-operative incisional pain and testicular swelling  Plan: Constipation.  1/2 bottle Magcitrate and fleets enema DVT prevention: lovenox Discharge planning - possible SNF vs [facility] inpatient rehab Mobilization will on floor at least  BID

## 2023-08-17 ENCOUNTER — Encounter (HOSPITAL_COMMUNITY): Payer: Self-pay | Admitting: Orthopedic Surgery

## 2023-08-17 LAB — HEMOGLOBIN AND HEMATOCRIT, BLOOD
HCT: 38.9 % — ABNORMAL LOW (ref 39.0–52.0)
Hemoglobin: 13.5 g/dL (ref 13.0–17.0)

## 2023-08-17 MED ORDER — BUPIVACAINE LIPOSOME 1.3 % IJ SUSP
INTRAMUSCULAR | Status: DC | PRN
  Administered 2023-08-13: 133 mg via PERINEURAL

## 2023-08-17 MED ORDER — SENNOSIDES-DOCUSATE SODIUM 8.6-50 MG PO TABS
2.0000 | ORAL_TABLET | Freq: Every day | ORAL | Status: DC
  Administered 2023-08-17 – 2023-08-22 (×5): 2 via ORAL
  Filled 2023-08-17 (×6): qty 2

## 2023-08-17 MED ORDER — BUPIVACAINE-EPINEPHRINE (PF) 0.5% -1:200000 IJ SOLN
INTRAMUSCULAR | Status: DC | PRN
  Administered 2023-08-13: 15 mL via PERINEURAL

## 2023-08-17 MED ORDER — FERROUS SULFATE 325 (65 FE) MG PO TABS
325.0000 mg | ORAL_TABLET | Freq: Two times a day (BID) | ORAL | Status: DC
  Administered 2023-08-18 – 2023-08-23 (×11): 325 mg via ORAL
  Filled 2023-08-17 (×12): qty 1

## 2023-08-17 NOTE — Plan of Care (Signed)
  Problem: Education: Goal: Knowledge of General Education information will improve Description: Including pain rating scale, medication(s)/side effects and non-pharmacologic comfort measures Outcome: Progressing   Problem: Health Behavior/Discharge P

## 2023-08-17 NOTE — Plan of Care (Signed)

## 2023-08-17 NOTE — Progress Notes (Signed)
 [name] is stable but he is showing some signs of possible anemia such as dizziness, pallor, and feeling cold. Therefore we will order a hemoglobin and hematocrit to assess his levels. We will also start him on oral iron. The plan is to have him be d/c to a SNF; awaiting placement.   Jeoffrey Sages PA-C Orthopedic Surgery [facility] [place] [phone]

## 2023-08-17 NOTE — Progress Notes (Signed)
 Occupational Therapy Treatment Patient Details Name: [name] MRN: [id number] DOB: May 01, 1961 Today's Date: 08/17/2023   History of present illness Pt is a 63 y/o male who presents s/p L4-S1 ALIF on 08/13/2023. Second stage of surgery to be performed 08/14/2023 -L4-S1 PLIF planned.PMH significant for  HTN, GERD, achalasia.   OT comments  Pt making steady progress toward goals and mobilizinf better today than yesterday however limited by dizziness and complaining of L leg pain. Had just ambulated with PT and complaining of dizziness. BP sitting 136/98; standing after 2 min 127/95. Educated on use of AE for LB ADL to increase independence and decrease burden of care on his wife. Overall S with functional mobility @ RW level. Wife present for session and verbalized understanding. Feel pt will make continued progress once dizziness resolves. Acute OT will continue to follow.       If plan is discharge home, recommend the following:  Assist for transportation;A little help with bathing/dressing/bathroom;A little help with walking and/or transfers   Equipment Recommendations  None recommended by OT    Recommendations for Other Services      Precautions / Restrictions Precautions Precautions: Fall Recall of Precautions/Restrictions: Intact Precaution/Restrictions Comments: handout/precuations reviewed with family Required Braces or Orthoses: Spinal Brace Spinal Brace: Lumbar corset;Applied in sitting position Restrictions Weight Bearing Restrictions Per Provider Order: No       Mobility Bed Mobility Overal bed mobility: Needs Assistance Bed Mobility: Sit to Sidelying Rolling: Modified independent (Device/Increase time)       Sit to sidelying: Supervision      Transfers Overall transfer level: Needs assistance Equipment used: Rolling walker (2 wheels) Transfers: Sit to/from Stand Sit to Stand: Supervision           General transfer comment: no physical assistance needed;  cues to push up from chair     Balance Overall balance assessment: Needs assistance Sitting-balance support: Feet supported Sitting balance-Leahy Scale: Good     Standing balance support: Reliant on assistive device for balance Standing balance-Leahy Scale: Fair Standing balance comment: able to release RW for peri-care                           ADL either performed or assessed with clinical judgement   ADL Overall ADL's : Needs assistance/impaired     Grooming: Set up   Upper Body Bathing: Set up;Sitting   Lower Body Bathing: Minimal assistance;Sit to/from stand   Upper Body Dressing : Set up   Lower Body Dressing: Minimal assistance   Toilet Transfer: Ambulation;Rolling walker (2 wheels);Supervision/safety   Toileting- Architect and Hygiene: Supervision/safety       Functional mobility during ADLs: Supervision/safety;Rolling walker (2 wheels);Cueing for safety General ADL Comments: Educated on use of AE for LB ADL; S for use of AE which significantly increases his independence; Wife present and ovserved session; will benefit form further practice with AE. Pt has scrotal support. Waiste band adjusted to improve fit. Educated pt that he would need to adjust support as he moved from sit - stand to adequately position scrotum and achieve support    Extremity/Trunk Assessment              Vision       Perception     Praxis     Communication Communication Communication: Impaired Factors Affecting Communication: Other (comment)   Cognition Arousal: Alert Behavior During Therapy: WFL for tasks assessed/performed Cognition: No apparent impairments  Following commands: Intact        Cueing   Cueing Techniques: Verbal cues, Gestural cues  Exercises      Shoulder Instructions       General Comments Pt states he had just returned from a walk but had been in the chair for several hours.  Educated pt on importance of not sitting longer than 45 min - 1 hr intervals and to off load back and spend soetime in bed to help reduce scrotal edema. Wife presetn. Bother verbalized understanding.    Pertinent Vitals/ Pain       Pain Assessment Pain Assessment: Faces Faces Pain Scale: Hurts little more Pain Location: scrotum; back Pain Descriptors / Indicators: Discomfort, Aching Pain Intervention(s): Limited activity within patient's tolerance, Repositioned, Ice applied  Home Living                                          Prior Functioning/Environment              Frequency  Min 2X/week        Progress Toward Goals  OT Goals(current goals can now be found in the care plan section)  Progress towards OT goals: Progressing toward goals  Acute Rehab OT Goals OT Goal Formulation: With patient Time For Goal Achievement: 08/28/23 Potential to Achieve Goals: Good ADL Goals Pt Will Perform Lower Body Bathing: with modified independence;sit to/from stand;with adaptive equipment Pt Will Perform Lower Body Dressing: with modified independence;sit to/from stand Pt Will Transfer to Toilet: with modified independence;ambulating;regular height toilet Pt Will Perform Toileting - Clothing Manipulation and hygiene: with modified independence;sit to/from stand Additional ADL Goal #1: Pt will demonstrate independence with bed mobility while following back precautions  Plan      Co-evaluation                 AM-PAC OT 6 Clicks Daily Activity     Outcome Measure   Help from another person eating meals?: None Help from another person taking care of personal grooming?: A Little Help from another person toileting, which includes using toliet, bedpan, or urinal?: A Little Help from another person bathing (including washing, rinsing, drying)?: A Little Help from another person to put on and taking off regular upper body clothing?: A Little Help from another  person to put on and taking off regular lower body clothing?: A Lot 6 Click Score: 18    End of Session Equipment Utilized During Treatment: Gait belt;Rolling walker (2 wheels)  OT Visit Diagnosis: Unsteadiness on feet (R26.81);Pain Pain - Right/Left:  (back; srotum) Pain - part of body:  (scrotum)   Activity Tolerance Patient tolerated treatment well   Patient Left with call bell/phone within reach;with family/visitor present;in bed   Nurse Communication Other (comment) (scrotal support ordered)        Time: 8879-8845 OT Time Calculation (min): 34 min  Charges: OT General Charges $OT Visit: 1 Visit OT Treatments $Self Care/Home Management : 23-37 mins  Kreg Sink, OT/L   Acute OT Clinical Specialist Acute Rehabilitation Services Pager [phone] Office [phone]   [facility] 08/17/2023, 2:03 PM

## 2023-08-17 NOTE — Anesthesia Procedure Notes (Signed)
 Anesthesia Regional Block: TAP block   Pre-Anesthetic Checklist: , timeout performed,  Correct Patient, Correct Site, Correct Laterality,  Correct Procedure, Correct Position, site marked,  Risks and benefits discussed,  Surgical consent,  Pre-op evaluation,  At surgeon's request and post-op pain management  Laterality: Left  Prep: chloraprep       Needles:  Injection technique: Single-shot      Needle Length: 9cm  Needle Gauge: 22     Additional Needles: Arrow StimuQuik ECHO Echogenic Stimulating PNB Needle  Procedures:,,,, ultrasound used (permanent image in chart),,    Narrative:  Start time: 08/13/2023 11:38 AM End time: 08/13/2023 11:43 AM Injection made incrementally with aspirations every 5 mL.  Performed by: Personally  Anesthesiologist: Arvie Latus, MD

## 2023-08-17 NOTE — Anesthesia Postprocedure Evaluation (Signed)
 Anesthesia Post Note  Patient: [name]  Procedure(s) Performed: ANTERIOR LUMBAR INTERBODY FUSION LUMBAR FOUR TO SACRAL ONE ABDOMINAL EXPOSURE FOR ANTERIOR LUMBAR SPINE SURGERY     Patient location during evaluation: PACU Anesthesia Type: General and Regional Level of consciousness: awake and alert Pain management: pain level controlled Vital Signs Assessment: post-procedure vital signs reviewed and stable Respiratory status: spontaneous breathing, nonlabored ventilation and respiratory function stable Cardiovascular status: blood pressure returned to baseline and stable Postop Assessment: no apparent nausea or vomiting Anesthetic complications: no   No notable events documented.                Jefferey Lippmann

## 2023-08-17 NOTE — Discharge Summary (Signed)
 Patient ID: [name] MRN: [id number] DOB/AGE: 1961-02-14 63 y.o.  Admit date: 08/13/2023 Discharge date: 08/23/2023  Admission Diagnoses:  Principal Problem:   S/P lumbar fusion   Discharge Diagnoses:  Principal Problem:   S/P lumbar fusion  status post Procedure(s): POSTERIOR LUMBAR FUSION 2 LEVEL  Past Medical History:  Diagnosis Date   Achalasia 04/11/2007   s/p laparoscopic Heller esophagocardiomyotomy with Toupet fundoplication   GERD (gastroesophageal reflux disease)    Hyperlipidemia    Hypertension    PONV (postoperative nausea and vomiting)     Surgeries: Procedure(s): POSTERIOR LUMBAR FUSION 2 LEVEL on 08/14/2023   Consultants: Treatment Team:  [name], MD  Discharged Condition: Improved  Hospital Course: [name] is an 63 y.o. male who was admitted 08/13/2023 for operative treatment of S/P lumbar fusion. Patient failed conservative treatments (please see the history and physical for the specifics) and had severe unremitting pain that affects sleep, daily activities and work/hobbies. After pre-op clearance, the patient was taken to the operating room on 08/14/2023 and underwent  Procedure(s): POSTERIOR LUMBAR FUSION 2 LEVEL.    Patient was given perioperative antibiotics:  Anti-infectives (From admission, onward)    Start     Dose/Rate Route Frequency Ordered Stop   08/14/23 2100  ceFAZolin  (ANCEF ) IVPB 1 g/50 mL premix        1 g 100 mL/hr over 30 Minutes Intravenous Every 8 hours 08/14/23 1720 08/15/23 0541   08/13/23 2225  ceFAZolin  (ANCEF ) IVPB 2g/100 mL premix        2 g 200 mL/hr over 30 Minutes Intravenous 30 min pre-op 08/13/23 2225 08/14/23 1350   08/13/23 1800  ceFAZolin  (ANCEF ) IVPB 1 g/50 mL premix        1 g 100 mL/hr over 30 Minutes Intravenous Every 8 hours 08/13/23 1339 08/14/23 0136   08/13/23 0540  ceFAZolin  (ANCEF ) IVPB 2g/100 mL premix        2 g 200 mL/hr over 30 Minutes Intravenous 30 min pre-op 08/13/23 0540  08/13/23 1125        Patient was given sequential compression devices and early ambulation to prevent DVT.   Patient benefited maximally from hospital stay and there were no complications. At the time of discharge, the patient was urinating/moving their bowels without difficulty, tolerating a regular diet, pain is controlled with oral pain medications and they have been cleared by PT/OT.   Recent vital signs: Patient Vitals for the past 24 hrs:  BP Temp Temp src Pulse Resp SpO2  08/23/23 0756 133/85 98.2 F (36.8 C) Oral 72 16 95 %  08/23/23 0418 125/80 98.1 F (36.7 C) -- 78 16 97 %  08/22/23 1951 (!) 137/90 98.5 F (36.9 C) -- 85 17 98 %     Recent laboratory studies:  Recent Labs    08/21/23 0657  CREATININE 1.18      Discharge Medications:   Allergies as of 08/23/2023       Reactions   Hydrochlorothiazide     Other Reaction(s): Not available, Unknown   Lisinopril    Other Reaction(s): Not available, Unknown        Medication List     STOP taking these medications    ascorbic acid  500 MG tablet Commonly known as: VITAMIN C    b complex vitamins capsule   multivitamin with minerals tablet       TAKE these medications    diazepam  10 MG tablet Commonly known as: VALIUM  Take 1 tablet (10 mg total) by  mouth every 8 (eight) hours as needed for muscle spasms.   enoxaparin  40 MG/0.4ML injection Commonly known as: LOVENOX  Inject 0.4 mLs (40 mg total) into the skin daily for 3 days. Start taking on: August 24, 2023   losartan -hydrochlorothiazide  100-25 MG tablet Commonly known as: HYZAAR Take 1 tablet by mouth daily.   omeprazole 40 MG capsule Commonly known as: PRILOSEC 40 mg daily.   ondansetron  4 MG tablet Commonly known as: ZOFRAN  Take 1 tablet (4 mg total) by mouth every 6 (six) hours as needed for nausea or vomiting.   Oxycodone  HCl 10 MG Tabs Take 1 tablet (10 mg total) by mouth every 8 (eight) hours for 5 days.                Durable Medical Equipment  (From admission, onward)           Start     Ordered   08/15/23 1007  For home use only DME Walker rolling  Once       Question Answer Comment  Walker: With 5 Inch Wheels   Patient needs a walker to treat with the following condition Weakness      08/15/23 1009            Diagnostic Studies: VAS US  LOWER EXTREMITY VENOUS (DVT) Result Date: 08/15/2023  Lower Venous DVT Study Patient Name:  [name]  Date of Exam:   08/15/2023 Medical Rec #: [id number]     Accession #:    [id number] Date of Birth: [birth year]      Patient Gender: M Patient Age:   74 years Exam Location:  [facility] Procedure:      VAS US  LOWER EXTREMITY VENOUS (DVT) Referring Phys: Eddy Goodell BROOKS --------------------------------------------------------------------------------  Indications: R/o DVT.  Risk Factors: Recent extended travel Surgery Lumbar fusion 2 08/14/23. Comparison Study: None. Performing Technologist: Estanislao Heimlich  Examination Guidelines: A complete evaluation includes B-mode imaging, spectral Doppler, color Doppler, and power Doppler as needed of all accessible portions of each vessel. Bilateral testing is considered an integral part of a complete examination. Limited examinations for reoccurring indications may be performed as noted. The reflux portion of the exam is performed with the patient in reverse Trendelenburg.  +---------+---------------+---------+-----------+----------+--------------+ RIGHT    CompressibilityPhasicitySpontaneityPropertiesThrombus Aging +---------+---------------+---------+-----------+----------+--------------+ CFV      Full           Yes      Yes                                 +---------+---------------+---------+-----------+----------+--------------+ SFJ      Full                                                        +---------+---------------+---------+-----------+----------+--------------+ FV Prox  Full                                                         +---------+---------------+---------+-----------+----------+--------------+ FV Mid   Full                                                        +---------+---------------+---------+-----------+----------+--------------+  FV DistalFull                                                        +---------+---------------+---------+-----------+----------+--------------+ PFV      Full                                                        +---------+---------------+---------+-----------+----------+--------------+ POP      Full           Yes      Yes                                 +---------+---------------+---------+-----------+----------+--------------+ PTV      Full                    Yes                                 +---------+---------------+---------+-----------+----------+--------------+ PERO     Partial                 Yes                                 +---------+---------------+---------+-----------+----------+--------------+   +---------+---------------+---------+-----------+----------+--------------+ LEFT     CompressibilityPhasicitySpontaneityPropertiesThrombus Aging +---------+---------------+---------+-----------+----------+--------------+ CFV      Full           Yes      Yes                                 +---------+---------------+---------+-----------+----------+--------------+ SFJ      Full                                                        +---------+---------------+---------+-----------+----------+--------------+ FV Prox  Full                                                        +---------+---------------+---------+-----------+----------+--------------+ FV Mid   Full                                                        +---------+---------------+---------+-----------+----------+--------------+ FV DistalFull                                                         +---------+---------------+---------+-----------+----------+--------------+  PFV      Full                                                        +---------+---------------+---------+-----------+----------+--------------+ POP      Full           Yes      Yes                                 +---------+---------------+---------+-----------+----------+--------------+ PTV      Full                    Yes                                 +---------+---------------+---------+-----------+----------+--------------+ PERO     Full                    Yes                                 +---------+---------------+---------+-----------+----------+--------------+     Summary: BILATERAL: - No evidence of deep vein thrombosis seen in the lower extremities, bilaterally. -No evidence of popliteal cyst, bilaterally.   *See table(s) above for measurements and observations. Electronically signed by Irvin Mantel on 08/15/2023 at 7:59:59 PM.    Final    DG Lumbar Spine 2-3 Views Result Date: 08/14/2023 CLINICAL DATA:  Lumbar fusion EXAM: LUMBAR SPINE - 2-3 VIEW COMPARISON:  08/13/2023 FLUOROSCOPY TIME:  Radiation Exposure Index (as provided by the fluoroscopic device): 144.17 mGy If the device does not provide the exposure index: Fluoroscopy Time:  3 minutes 24 seconds Number of Acquired Images:  4 FINDINGS: Previously seen interbody fusion at L4-5 and L5-S1 is noted. New pedicle screws at L4, L5 and S1 are seen with posterior fixation. IMPRESSION: Interval posterior fixation at L4-S1. Electronically Signed   By: Violeta Grey M.D.   On: 08/14/2023 19:29   DG C-Arm 1-60 Min-No Report Result Date: 08/14/2023 Fluoroscopy was utilized by the requesting physician.  No radiographic interpretation.   DG C-Arm 1-60 Min-No Report Result Date: 08/14/2023 Fluoroscopy was utilized by the requesting physician.  No radiographic interpretation.   DG C-Arm 1-60 Min-No Report Result Date: 08/14/2023 Fluoroscopy was  utilized by the requesting physician.  No radiographic interpretation.   DG Lumbar Spine 2-3 Views Result Date: 08/13/2023 CLINICAL DATA:  Elective surgery.  L4-S1 ALIF. EXAM: LUMBAR SPINE - 2-3 VIEW COMPARISON:  Lumbar spine radiographs 08/13/2023. Lumbar MRI 09/20/2020. FINDINGS: C-arm fluoroscopy was provided in the operating room without the presence of a radiologist.1 minute and 30 seconds fluoroscopy time. 70.79 mGy air kerma. Three C-arm fluoroscopic images were obtained intraoperatively and are submitted for post operative interpretation. These images demonstrate interval anterior discectomy and fusion at L4-5 and L5-S1. The hardware appears well positioned. No complications identified. Please see intraoperative findings for further detail. IMPRESSION: Intraoperative fluoroscopic guidance for L4-S1 anterior discectomy and fusion. Electronically Signed   By: Elmon Hagedorn M.D.   On: 08/13/2023 15:20   DG C-Arm 1-60 Min-No Report Result Date: 08/13/2023 Fluoroscopy was utilized by the requesting physician.  No radiographic interpretation.  DG C-Arm 1-60 Min-No Report Result Date: 08/13/2023 Fluoroscopy was utilized by the requesting physician.  No radiographic interpretation.   DG C-Arm 1-60 Min-No Report Result Date: 08/13/2023 Fluoroscopy was utilized by the requesting physician.  No radiographic interpretation.   DG C-Arm 1-60 Min-No Report Result Date: 08/13/2023 Fluoroscopy was utilized by the requesting physician.  No radiographic interpretation.   DG OR LOCAL ABDOMEN Result Date: 08/13/2023 CLINICAL DATA:  [id number] Elective surgery [id number] EXAM: OR LOCAL ABDOMEN COMPARISON:  08/13/2023 12/22/2022 and previous FINDINGS: Implanted hardware in the L4-5 and L5-S1 interspaces. No unexpected radiodense foreign body. No acute bone abnormality. IMPRESSION: 1. No acute findings or radiodense foreign body. 2. Lower lumbar fusion hardware. I telephoned the critical test results to OR4 at the time  of interpretation. Electronically Signed   By: Nicoletta Barrier M.D.   On: 08/13/2023 11:18        Discharge Plan:  discharge to be D/c to home today  Disposition:  Assessment: Eirik is a very pleasant 63 year old male who is postop day 9 from ALIF L4-L5 and L5-S1 and POD 8 from PSIF L4-L5 and L5-S1. Both surgical procedures were a success and were without complications. On the evening of POD 1 he did have an episode of left eye irritation with burning, and photophobia. I did prescribe an eye patch and non-antibiotic eye drops as I believed that this was due to eye irritation from anesthesia's tape or a possible corneal abrasion. This issue has since resolved and I have discontinued the eye drops. POD 2 night the patient did have an episode of diaphoresis, chills, and dizziness. Upon further exam the nursing staff did notice erythema and edema of his genitals and scrotum. The patient stated that his genital arrea was painful during that time. I did examine this area the next morning with the help of June RN has my chaperone; the genitals and scrotum were still slightly edematous and erythematous but has improved since the previous night. Patient denies any hematuria or dysuria at this time. Doppler DVT US  of bilateral LE was negative for DVT bilaterally. He did have some s/sx of anemia so we ordered a hemoglobin/ hematocrit and we did provide oral supplemental iron. His levels have remained normal.  Overall, he is stable but does report some incisional back and abdominal pain but states that it has been improving.    He does state that he pre-operative leg pain has improved. He was evaluated by PT and OT and has been cleared but they did recommend a rolling walker.Positive flatus, positive void, positive BM. Dressing's are C/D/I.   Patient to go home today. Medications have been sent; medication include a three day supple of Lovenox  injections. Patient is aware that he will need to call to see us  next  Wednesday for a follow- up appointment.    Signed: [place] Due for Dr. Mort Ards [facility] [phone] 08/23/2023, 2:10 PM

## 2023-08-17 NOTE — Progress Notes (Signed)
    Subjective: Procedure(s) (LRB): POSTERIOR LUMBAR FUSION 2 LEVEL (N/A) 3 Days Post-Op  Patient reports pain as severe.  Reports decreased leg pain reports incisional back pain  and abdominal pain  Positive void Positive bowel movement Positive flatus Negative chest pain or shortness of breath Reports that genital edema and erythema has slightly improved  Objective: Vital signs in last 24 hours: Temp:  [98.2 F (36.8 C)-98.9 F (37.2 C)] 98.2 F (36.8 C) (04/18 0355) Pulse Rate:  [80-102] 80 (04/18 0355) Resp:  [17] 17 (04/18 0355) BP: (113-139)/(77-94) 126/84 (04/18 0355) SpO2:  [94 %-97 %] 97 % (04/18 0355)  Intake/Output from previous day: 04/17 0701 - 04/18 0700 In: 790 [P.O.:790] Out: 1100 [Urine:1100]  Labs: Recent Labs    08/14/23 1226 08/14/23 1736  WBC  --  13.1*  RBC  --  3.91*  HCT 36.0* 36.5*  PLT  --  196   Recent Labs    08/14/23 1226 08/14/23 1736  NA 138  --   K 3.5  --   CL 104  --   BUN 14  --   CREATININE 1.00 1.22  GLUCOSE 113*  --    No results for input(s): "LABPT", "INR" in the last 72 hours.  Physical Exam: Neurologically intact ABD soft Neurovascular intact Sensation intact distally Intact pulses distally Dorsiflexion/Plantar flexion intact Incision: dressing C/D/I Compartment soft Body mass index is 33.41 kg/m.   Assessment/Plan: Assessment: [name] is a very pleasant 63 year old male who is postop day 4 from ALIF L4-L5 and L5-S1 and POD 3 from PSIF L4-L5 and L5-S1. Both surgical procedures were a success and were without complications. Tuesday evening he did have an episode of left eye irritation with burning, and photophobia. I did prescribe an eye patch and non-antibiotic eye drops as I believed that this was due to eye irritation from anesthesia's tape or a possible corneal abrasion. This issue has since resolved and I have discontinued the eye drops. Wednesday night the patient did have an episode of diaphoresis, chills,  and dizziness. Upon further exam the nursing staff did notice erythema and edema of his genitals and scrotum. The patient stated that his genital arrea was painful during that time. I did examine this area Thursday morning with the help of June RN has my chaperone; the genitals and scrotum were still slightly edematous and erythematous but has improved since the previous night. Patient denies any hematuria or dysuria at this time. Wednesday,I did prescribe a nasal decongestant for his congestion s/sx and he states that this is helping to relieve his symptoms. Doppler DVT US  of bilateral LE Wednesday was negative for DVT bilaterally. Overall, he is stable but does report incisional back and abdominal pain.    He does state that he pre-operative leg pain has improved. He was evaluated by PT and OT and has been cleared but they did recommend a rolling walker. At this time for d/c planning we are discussing possible SNF vs inpatient rehabilitation as he still requires assistance and he does not have access to that at home.  Positive flatus, positive void, positive BM. Dressing's are C/D/I.   Plan: - Continue incentive spirometry - Continue mobilization at least BID assisted with staff -continue medical pain management  - Lovenox  for DVT prevention; patient education needed for injections  - D/C to SNF vs [facility] re[facility]      [employer] PA-C [facility] [phone]

## 2023-08-18 NOTE — Progress Notes (Signed)
 Subjective: 4 Days Post-Op Procedure(s) (LRB): POSTERIOR LUMBAR FUSION 2 LEVEL (N/A) Patient reports pain as moderate.    Passing gas.  Enema yesterday.  Tolerating regular diet.  SNF placement pending.  Objective: Vital signs in last 24 hours: Temp:  [98 F (36.7 C)-98.6 F (37 C)] (P) 98 F (36.7 C) (04/19 0653) Pulse Rate:  [83-101] (P) 83 (04/19 0653) Resp:  [16-18] (P) 17 (04/19 0653) BP: (127-142)/(93-95) (P) 138/94 (04/19 0653) SpO2:  [97 %-99 %] (P) 98 % (04/19 0653)  Intake/Output from previous day: 04/18 0701 - 04/19 0700 In: 480 [P.O.:480] Out: 1450 [Urine:1450] Intake/Output this shift: Total I/O In: 240 [P.O.:240] Out: -   Recent Labs    08/17/23 1217  HGB 13.5   Recent Labs    08/17/23 1217  HCT 38.9*   No results for input(s): "NA", "K", "CL", "CO2", "BUN", "CREATININE", "GLUCOSE", "CALCIUM " in the last 72 hours. No results for input(s): "LABPT", "INR" in the last 72 hours.  PE:  wn wd male in nad.  Incisions dressed and dry.  5/5 strength at the quad and in PF and DF of the ankle and toes.  Sens to LT intact throughout the L LE.   Assessment/Plan: 4 Days Post-Op Procedure(s) (LRB): POSTERIOR LUMBAR FUSION 2 LEVEL (N/A) Up with therapy No indication for transfusion.  Continue iron supplementation. D/c with SNF bed available.     Amada Backer 08/18/2023, 10:21 AM

## 2023-08-18 NOTE — Plan of Care (Signed)

## 2023-08-18 NOTE — Progress Notes (Signed)
 Occupational Therapy Treatment Patient Details Name: [name] MRN: [id number] DOB: 11-24-1960 Today's Date: 08/18/2023   History of present illness Pt is a 63 y/o male who presents s/p L4-S1 ALIF on 08/13/2023. Second stage of surgery to be performed 08/14/2023 -L4-S1 PLIF planned.PMH significant for  HTN, GERD, achalasia.   OT comments  Pt. Seen for skilled OT treatment session with family present.  Pt. Able to use A/E for LB ADL tasks with set up and cues for sequencing and trouble shooting.  S for ambulation in b.room and throughout the room.  Reports dizziness has improved but c/o intermittent cold feeling in his hands/fingers.  Pt. Reports md aware.  Cues for hand placement prior to sitting down but moving well.  Cont. With acute OT POC.        If plan is discharge home, recommend the following:  Assist for transportation;A little help with bathing/dressing/bathroom;A little help with walking and/or transfers   Equipment Recommendations  None recommended by OT    Recommendations for Other Services      Precautions / Restrictions Precautions Precautions: Fall Recall of Precautions/Restrictions: Intact Required Braces or Orthoses: Spinal Brace Spinal Brace: Lumbar corset;Applied in sitting position       Mobility Bed Mobility               General bed mobility comments: Pt was received sitting up in the recliner    Transfers Overall transfer level: Needs assistance Equipment used: Rolling walker (2 wheels) Transfers: Sit to/from Stand, Bed to chair/wheelchair/BSC Sit to Stand: Supervision     Step pivot transfers: Supervision     General transfer comment: no physical assistance needed; cues to reach back for the chair     Balance                                           ADL either performed or assessed with clinical judgement   ADL Overall ADL's : Needs assistance/impaired     Grooming: Standing;Cueing for compensatory  techniques Grooming Details (indicate cue type and reason): reviewed tech. for oral care while in the b.room for not "bending" states he remembers use of cups to prevent bending           Upper Body Dressing Details (indicate cue type and reason): pt. with brace on upon arrival, declines need for practice don/doff Lower Body Dressing: With adaptive equipment;Supervision/safety;Cueing for sequencing;Sitting/lateral leans Lower Body Dressing Details (indicate cue type and reason): don/doff socks with use of sock aide and reacher -reviewed how to don/doff shorts/pants with reacher Toilet Transfer: Ambulation;Rolling walker (2 wheels);Supervision/safety       Tub/ Shower Transfer: Walk-in shower;Contact guard assist;Rolling walker (2 wheels);Anterior/posterior Tub/Shower Transfer Details (indicate cue type and reason): pt. describes walk in shower, able to perform simulated posterior transfer stepping backwards over est. ledge height and then back out forward with education on leaving rw outside of shower stall door Functional mobility during ADLs: Supervision/safety;Rolling walker (2 wheels);Cueing for safety General ADL Comments: pt. moving well, states dizziness has subsided, reports feeling cold then hot esp. in hands states md aware and told him part of healing process.  doing well with a/e    Extremity/Trunk Assessment              Vision       Perception     Praxis     Communication Communication Communication:  Impaired   Cognition Arousal: Alert Behavior During Therapy: WFL for tasks assessed/performed Cognition: No apparent impairments                               Following commands: Intact        Cueing   Cueing Techniques: Verbal cues, Gestural cues  Exercises      Shoulder Instructions       General Comments  The family dog Eliseo Guiles is very special to them as it was a gift for the wife from their son who died from COVID here at [facility].       Pertinent Vitals/ Pain       Pain Assessment Pain Assessment: No/denies pain  Home Living                                          Prior Functioning/Environment              Frequency  Min 2X/week        Progress Toward Goals  OT Goals(current goals can now be found in the care plan section)  Progress towards OT goals: Progressing toward goals     Plan      Co-evaluation                 AM-PAC OT "6 Clicks" Daily Activity     Outcome Measure   Help from another person eating meals?: None Help from another person taking care of personal grooming?: A Little Help from another person toileting, which includes using toliet, bedpan, or urinal?: A Little Help from another person bathing (including washing, rinsing, drying)?: A Little Help from another person to put on and taking off regular upper body clothing?: A Little Help from another person to put on and taking off regular lower body clothing?: A Lot 6 Click Score: 18    End of Session Equipment Utilized During Treatment: Rolling walker (2 wheels)  OT Visit Diagnosis: Unsteadiness on feet (R26.81);Pain   Activity Tolerance Patient tolerated treatment well   Patient Left in chair;with call bell/phone within reach;with family/visitor present   Nurse Communication          Time: 4098-1191 OT Time Calculation (min): 27 min  Charges: OT General Charges $OT Visit: 1 Visit OT Treatments $Self Care/Home Management : 23-37 mins  Howell Macintosh, COTA/L Acute Rehabilitation [phone]   Leory Rands Lor[place]-COTA/L  08/18/2023, 1:11 PM

## 2023-08-18 NOTE — Progress Notes (Addendum)
 Physical Therapy Treatment Patient Details Name: [name] MRN: [id number] DOB: 1961-03-13 Today's Date: 08/18/2023   History of Present Illness Pt is a 63 y/o male who presents s/p ALIF L4-L5  and L5-S1 on 4/14 and s/p PSIF L4-L5 and L5-S1 on 4/15. PMH significant for  HTN, GERD, achalasia.    PT Comments  Pt received in recliner, pleasantly agreeable to therapy session and with good participation and tolerance for transfer training. Pt limited to household distance gait trial due c/o mild lightheadedness, and when BP assessed seated/standing, pt found to be hypertensive and with tachycardia with exertion, RN notified. Pt returned to room via recliner after gait/stair training in PT gym due to unstable VS. Pt continues to benefit from PT services to progress toward functional mobility goals, disposition defer to surgeon recommendation, discussed with supervising PT Ryan L, pt making slow progress toward his goals.   Orthostatic Sitting  BP- Sitting (!) 139/111  Pulse- Sitting 120  Orthostatic Standing at 0 minutes  BP- Standing at 0 minutes (!) 132/107  Pulse- Standing at 0 minutes 129     If plan is discharge home, recommend the following: A little help with walking and/or transfers;A little help with bathing/dressing/bathroom;Assistance with cooking/housework;Assist for transportation;Help with stairs or ramp for entrance   Can travel by private vehicle        Equipment Recommendations  Rolling walker (2 wheels)    Recommendations for Other Services       Precautions / Restrictions Precautions Precautions: Fall Recall of Precautions/Restrictions: Impaired Precaution/Restrictions Comments: Pt able to report 2/3 precs but non-compliant with "no twisting" precaution frequently during session Required Braces or Orthoses: Spinal Brace Spinal Brace: Lumbar corset;Applied in sitting position Restrictions Weight Bearing Restrictions Per Provider Order: No     Mobility  Bed  Mobility               General bed mobility comments: Pt was received sitting up in the recliner and wants to return to chair at end of session    Transfers Overall transfer level: Needs assistance Equipment used: Rolling walker (2 wheels) Transfers: Sit to/from Stand Sit to Stand: Supervision, Contact guard assist           General transfer comment: Cues for body mechanics, CGA to sit to mat table for safety without arm rests. Pt c/o intermittent lightheadedness    Ambulation/Gait Ambulation/Gait assistance: Contact guard assist Gait Distance (Feet): 80 Feet Assistive device: Rolling walker (2 wheels) Gait Pattern/deviations: Step-through pattern, Decreased stride length Gait velocity: Decreased Gait velocity interpretation: <1.31 ft/sec, indicative of household ambulator   General Gait Details: Decreased bil heel strike, pt frequently pausing, especially when speaking. Pt needs reminders when turning to move hips/feet before RW as pt tending to twist when moving RW first. Poor carryover of this cue.   Stairs Stairs: Yes Stairs assistance: Min assist Stair Management: No rails, Step to pattern, Forwards, With walker Number of Stairs: 2 General stair comments: manual assist for RW placement ascending 2 steps without rails, per home set-up. Pt needing minA for stability and assist to stabilize RW with descent/ascent. No buckling but does c/o mild lightheadedness.   Wheelchair Mobility     Tilt Bed    Modified Rankin (Stroke Patients Only)       Balance Overall balance assessment: Needs assistance Sitting-balance support: Feet supported Sitting balance-Leahy Scale: Good     Standing balance support: Reliant on assistive device for balance, No upper extremity supported Standing balance-Leahy Scale: Poor Standing balance  comment: able to release RW to wash his hands but reliant on RW for dynamic standing tasks                             Communication Communication Communication: Impaired  Cognition Arousal: Alert Behavior During Therapy: [facility] for tasks assessed/performed                             Following commands: Intact      Cueing Cueing Techniques: Verbal cues, Gestural cues  Exercises      General Comments General comments (skin integrity, edema, etc.): hypertensive sitting and standing, BP MAP stable with postural changes; tachy, c/o pain. Ice applied for L posterior hip/lower back and scrotal areas per patient request.      Pertinent Vitals/Pain Pain Assessment Pain Assessment: Faces Faces Pain Scale: Hurts even more Pain Location: scrotum; back Pain Descriptors / Indicators: Discomfort, Aching, Grimacing, Guarding Pain Intervention(s): Limited activity within patient's tolerance, Monitored during session, Repositioned, Ice applied    Home Living                          Prior Function            PT Goals (current goals can now be found in the care plan section) Acute Rehab PT Goals Patient Stated Goal: less pain and dizziness so I can go home PT Goal Formulation: With patient Time For Goal Achievement: 08/21/23 Progress towards PT goals: Progressing toward goals    Frequency    Min 5X/week      PT Plan      Co-evaluation              AM-PAC PT "6 Clicks" Mobility   Outcome Measure  Help needed turning from your back to your side while in a flat bed without using bedrails?: A Little Help needed moving from lying on your back to sitting on the side of a flat bed without using bedrails?: A Little Help needed moving to and from a bed to a chair (including a wheelchair)?: A Little Help needed standing up from a chair using your arms (e.g., wheelchair or bedside chair)?: A Little Help needed to walk in hospital room?: A Little Help needed climbing 3-5 steps with a railing? : A Lot (mod cues) 6 Click Score: 17    End of Session Equipment Utilized During  Treatment: Gait belt Activity Tolerance: Patient tolerated treatment well;Other (comment);Treatment limited secondary to medical complications (Comment) (mild lightheadedness, tachy, elevated BP) Patient left: in chair;with call bell/phone within reach;with chair alarm set;Other (comment) (reclined) Nurse Communication: Mobility status;Patient requests pain meds PT Visit Diagnosis: Unsteadiness on feet (R26.81);Pain Pain - part of body:  (back and scrotal areas)     Time: 1324-4010 PT Time Calculation (min) (ACUTE ONLY): 36 min  Charges:    $Gait Training: 8-22 mins $Therapeutic Activity: 8-22 mins PT General Charges $$ ACUTE PT VISIT: 1 Visit                     Crislyn Willbanks P., PTA Acute Rehabilitation Services Secure Chat Preferred 9a-5:30pm Office: [phone]    Arville Laughter 08/18/2023, 3:33 PM

## 2023-08-19 ENCOUNTER — Other Ambulatory Visit: Payer: Self-pay

## 2023-08-19 NOTE — Progress Notes (Signed)
   Subjective: 5 Days Post-Op Procedure(s) (LRB): POSTERIOR LUMBAR FUSION 2 LEVEL (N/A) Patient reports pain as mild.   Patient seen in rounds for Dr. Vaughn Georges. Patient is resting in bed on exam this morning. He reports pain in his left leg and into his left groin which is similar to pre-op Minimal back pain +BM, voiding   Objective: Vital signs in last 24 hours: Temp:  [97.7 F (36.5 C)-98.9 F (37.2 C)] 97.7 F (36.5 C) (04/20 0823) Pulse Rate:  [81-115] 91 (04/20 0823) Resp:  [16-19] 16 (04/20 0823) BP: (124-141)/(79-107) 141/103 (04/20 0823) SpO2:  [92 %-98 %] 98 % (04/20 0823)  Intake/Output from previous day: No intake or output data in the 24 hours ending 08/19/23 0912   Intake/Output this shift: No intake/output data recorded.  Labs: Recent Labs    08/17/23 1217  HGB 13.5   Recent Labs    08/17/23 1217  HCT 38.9*   No results for input(s): "NA", "K", "CL", "CO2", "BUN", "CREATININE", "GLUCOSE", "CALCIUM " in the last 72 hours. No results for input(s): "LABPT", "INR" in the last 72 hours.  Exam: General - Patient is Alert and Oriented Extremity - Neurologically intact Sensation intact distally Intact pulses distally Dorsiflexion/Plantar flexion intact Dressing - dressings on the abdomen and back  C/D/I Motor Function - intact, moving foot and toes well on exam.   Past Medical History:  Diagnosis Date   Achalasia 04/11/2007   s/p laparoscopic Heller esophagocardiomyotomy with Toupet fundoplication   GERD (gastroesophageal reflux disease)    Hyperlipidemia    Hypertension    PONV (postoperative nausea and vomiting)     Assessment/Plan: 5 Days Post-Op Procedure(s) (LRB): POSTERIOR LUMBAR FUSION 2 LEVEL (N/A) Principal Problem:   S/P lumbar fusion  Estimated body mass index is 33.41 kg/m as calculated from the following:   Height as of this encounter: 5\' 6"  (1.676 m).   Weight as of this encounter: 93.9 kg.   Plan: - Continue incentive  spirometry - Continue mobilization at least BID assisted with staff -continue medical pain management  - Lovenox  for DVT prevention; patient education needed for injections  - D/C to SNF vs [facility] inpatient rehab    Kim Pen, PA-C Orthopedic Surgery [phone] 08/19/2023, 9:12 AM

## 2023-08-20 NOTE — Progress Notes (Signed)
    Subjective: Procedure(s) (LRB): POSTERIOR LUMBAR FUSION 2 LEVEL (N/A) 6 Days Post-Op  Patient reports pain as moderate.  Reports decreased leg pain reports incisional back pain  and abdominal pain  Positive void Positive bowel movement Positive flatus Negative chest pain or shortness of breath  Patient seen today with Dr. Vaughn Georges   Objective: Vital signs in last 24 hours: Temp:  [98.3 F (36.8 C)-98.7 F (37.1 C)] 98.3 F (36.8 C) (04/21 0709) Pulse Rate:  [71-90] 83 (04/21 0709) Resp:  [16-18] 16 (04/21 0432) BP: (113-136)/(79-95) 136/95 (04/21 0709) SpO2:  [93 %-98 %] 98 % (04/21 0709)  Intake/Output from previous day: 04/20 0701 - 04/21 0700 In: 480 [P.O.:480] Out: 725 [Urine:725]  Labs: No results for input(s): "WBC", "RBC", "HCT", "PLT" in the last 72 hours. No results for input(s): "NA", "K", "CL", "CO2", "BUN", "CREATININE", "GLUCOSE", "CALCIUM " in the last 72 hours. No results for input(s): "LABPT", "INR" in the last 72 hours.  Physical Exam: Neurologically intact ABD soft Neurovascular intact Sensation intact distally Intact pulses distally Dorsiflexion/Plantar flexion intact Incision: dressing C/D/I Compartment soft Body mass index is 33.41 kg/m.   Assessment/Plan: Assessment: [name] is a very pleasant 63 year old male who is postop day 7 from ALIF L4-L5 and L5-S1 and POD 6 from PSIF L4-L5 and L5-S1.Patient was seen today with Dr. Vaughn Georges.  Doppler DVT US  of bilateral LE Wednesday was negative for DVT bilaterally. Overall, he is stable but does report incisional back and abdominal pain. Routine DVT doppler last week was negative for DVT. Plan is to D/c to SNF. Patient has normal hemoglobin at this time.     Plan: - Continue incentive spirometry - Continue mobilization at least BID assisted with staff -continue medical pain management  - Lovenox  for DVT prevention; patient education needed for injections  -continue oral iron at this time - D/C to  SNF vs [facility] inpatient rehab  [employer] PA-C [facility] [phone]

## 2023-08-20 NOTE — TOC Progression Note (Signed)
 Transition of Care [facility]) - Progression Note    Patient Details  Name: [name] MRN: [id number] Date of Birth: 10/01/1960  [facility] CM/SW Contact  Alisa App, RN Phone Number: 08/20/2023, 12:51 PM  Clinical Narrative:    MD states pt to transition to SNF. NCM made workers comp provider Abron Hoist @  [phone] aware. Clinicals emailed to [email or web address].  TOC team following and will assist with needs...  Expected Discharge Plan: Skilled Nursing Facility Barriers to Discharge: Continued Medical Work up  Expected Discharge Plan and Services                         DME Arranged: Education officer, community spoke with at DME Agency: Workers Comp             Social Determinants of Health (SDOH) Interventions SDOH Screenings   Food Insecurity: No Food Insecurity (08/19/2023)  Housing: Low Risk  (08/19/2023)  Transportation Needs: No Transportation Needs (08/14/2023)  Utilities: Not At Risk (08/14/2023)  Alcohol Screen: Low Risk  (05/03/2017)  Social Connections: Unknown (09/12/2021)   Received from [facility]  Tobacco Use: Low Risk  (08/14/2023)    Readmission Risk Interventions     No data to display

## 2023-08-20 NOTE — Progress Notes (Signed)
 Occupational Therapy Treatment Patient Details Name: [name] MRN: [id number] DOB: 1960/12/14 Today's Date: 08/20/2023   History of present illness Pt is a 63 y/o male who presents s/p ALIF L4-L5  and L5-S1 on 4/14 and s/p PSIF L4-L5 and L5-S1 on 4/15. PMH significant for  HTN, GERD, achalasia.   OT comments  Patient demonstrating good gains with OT treatment with supervision to get to EOB with good technique for log rolling. Patient able to donn back brace with setup and supervision. Patient able to recall back precautions by remembering BLT. Patient able to ambulated to bathroom to void standing at toilet and performed grooming and UB bathing tasks standing at sink. AE training performed with reacher and socks and with supervision to changing socks and simulated donning pants. Reviewed home setup and shower situation and education on 3n1 use of shower chair. Discharge recommendations continue to be appropriate. Acute OT to continue to follow to address established goals.       If plan is discharge home, recommend the following:  Assist for transportation;A little help with bathing/dressing/bathroom;A little help with walking and/or transfers   Equipment Recommendations  None recommended by OT    Recommendations for Other Services      Precautions / Restrictions Precautions Precautions: Fall Recall of Precautions/Restrictions: Impaired Precaution/Restrictions Comments: able to recall 3/3 back precautions Required Braces or Orthoses: Spinal Brace Spinal Brace: Lumbar corset;Applied in sitting position Restrictions Weight Bearing Restrictions Per Provider Order: No       Mobility Bed Mobility Overal bed mobility: Needs Assistance Bed Mobility: Sidelying to Sit   Sidelying to sit: Supervision       General bed mobility comments: demonstrated good understanding of log rolling technique    Transfers Overall transfer level: Needs assistance Equipment used: Rolling walker (2  wheels) Transfers: Sit to/from Stand Sit to Stand: Supervision           General transfer comment: cues for hand placment and supervision to stand from EOB and recliner     Balance Overall balance assessment: Needs assistance Sitting-balance support: Feet supported Sitting balance-Leahy Scale: Good     Standing balance support: Single extremity supported, Bilateral upper extremity supported, No upper extremity supported, During functional activity Standing balance-Leahy Scale: Poor Standing balance comment: able to stand at sink for self care tasks with no UE support                           ADL either performed or assessed with clinical judgement   ADL Overall ADL's : Needs assistance/impaired     Grooming: Wash/dry hands;Wash/dry face;Oral care;Supervision/safety;Cueing for compensatory techniques Grooming Details (indicate cue type and reason): education on compensation technique to avoid bending when brushing teeth Upper Body Bathing: Standing;Supervision/ safety       Upper Body Dressing : Supervision/safety;Set up;Sitting Upper Body Dressing Details (indicate cue type and reason): able to donn brace seated on EOB with supervision Lower Body Dressing: With adaptive equipment;Supervision/safety;Cueing for sequencing;Sitting/lateral leans Lower Body Dressing Details (indicate cue type and reason): education on reacher use for donning LB clothing and doffing socks and use of sock aide with supervision to perform Toilet Transfer: Ambulation;Rolling walker (2 wheels);Supervision/safety   Toileting- Architect and Hygiene: Supervision/safety       Functional mobility during ADLs: Supervision/safety;Rolling walker (2 wheels);Cueing for safety General ADL Comments: Discussed bathroom setup at home and use of BSC for shower seat for walk in shower    Extremity/Trunk Assessment Upper  Extremity Assessment Upper Extremity Assessment: Overall WFL for  tasks assessed            Vision       Perception     Praxis     Communication Communication Communication: Impaired   Cognition Arousal: Alert Behavior During Therapy: WFL for tasks assessed/performed Cognition: No apparent impairments                               Following commands: Intact        Cueing   Cueing Techniques: Verbal cues, Gestural cues  Exercises      Shoulder Instructions       General Comments      Pertinent Vitals/ Pain       Pain Assessment Pain Assessment: Faces Faces Pain Scale: Hurts even more Pain Location: scrotum, back, LLE Pain Descriptors / Indicators: Discomfort, Aching, Grimacing, Guarding Pain Intervention(s): Limited activity within patient's tolerance, Monitored during session, Repositioned  Home Living                                          Prior Functioning/Environment              Frequency  Min 2X/week        Progress Toward Goals  OT Goals(current goals can now be found in the care plan section)  Progress towards OT goals: Progressing toward goals  Acute Rehab OT Goals Patient Stated Goal: less pain OT Goal Formulation: With patient Time For Goal Achievement: 08/28/23 Potential to Achieve Goals: Good ADL Goals Pt Will Perform Lower Body Bathing: with modified independence;sit to/from stand;with adaptive equipment Pt Will Perform Lower Body Dressing: with modified independence;sit to/from stand Pt Will Transfer to Toilet: with modified independence;ambulating;regular height toilet Pt Will Perform Toileting - Clothing Manipulation and hygiene: with modified independence;sit to/from stand Additional ADL Goal #1: Pt will demonstrate independence with bed mobility while following back precautions  Plan      Co-evaluation                 AM-PAC OT "6 Clicks" Daily Activity     Outcome Measure   Help from another person eating meals?: None Help from another  person taking care of personal grooming?: A Little Help from another person toileting, which includes using toliet, bedpan, or urinal?: A Little Help from another person bathing (including washing, rinsing, drying)?: A Little Help from another person to put on and taking off regular upper body clothing?: A Little Help from another person to put on and taking off regular lower body clothing?: A Lot 6 Click Score: 18    End of Session Equipment Utilized During Treatment: Gait belt;Rolling walker (2 wheels)  OT Visit Diagnosis: Unsteadiness on feet (R26.81);Pain Pain - Right/Left: Left Pain - part of body: Leg (back and scrotum)   Activity Tolerance Patient tolerated treatment well   Patient Left in chair;with call bell/phone within reach   Nurse Communication Mobility status        Time: 1610-9604 OT Time Calculation (min): 28 min  Charges: OT General Charges $OT Visit: 1 Visit OT Treatments $Self Care/Home Management : 23-37 mins  Anitra Barn, OTA Acute Rehabilitation Services  Office [phone]   Jovita Nipper 08/20/2023, 10:36 AM

## 2023-08-20 NOTE — Plan of Care (Signed)

## 2023-08-20 NOTE — Progress Notes (Signed)
 Physical Therapy Treatment Patient Details Name: [name] MRN: [id number] DOB: 1961/02/26 Today's Date: 08/20/2023   History of Present Illness Pt is a 63 y/o male who presents s/p ALIF L4-L5  and L5-S1 on 4/14 and s/p PSIF L4-L5 and L5-S1 on 4/15. PMH significant for  HTN, GERD, achalasia.    PT Comments  Continuing work on functional mobility and activity tolerance;  session focused on progressive amb, with good progress; walked in the hallway with RW and CGA; Anticipate continuing good progress; We discussed options and considertions for dc home if SNF option is unavailable -- If not SNF, recommend HHPT/OT    If plan is discharge home, recommend the following: A little help with walking and/or transfers;A little help with bathing/dressing/bathroom;Assistance with cooking/housework;Assist for transportation;Help with stairs or ramp for entrance   Can travel by private vehicle        Equipment Recommendations  Rolling walker (2 wheels)    Recommendations for Other Services       Precautions / Restrictions Precautions Precautions: Fall Recall of Precautions/Restrictions: Intact Precaution/Restrictions Comments: able to recall 3/3 back precautions Required Braces or Orthoses: Spinal Brace Spinal Brace: Lumbar corset;Applied in sitting position     Mobility  Bed Mobility                    Transfers Overall transfer level: Needs assistance Equipment used: Rolling walker (2 wheels) Transfers: Sit to/from Stand Sit to Stand: Supervision           General transfer comment: cues for hand placment and supervision to stand from  recliner    Ambulation/Gait Ambulation/Gait assistance: Contact guard assist Gait Distance (Feet): 180 Feet Assistive device: Rolling walker (2 wheels) Gait Pattern/deviations: Step-through pattern, Decreased stride length       General Gait Details: Decreased bil heel strike, pt frequently pausing, especially when speaking. Much  better with avoiding twisting with turns today   Stairs         General stair comments: Pt correctly described technique for stairs   Wheelchair Mobility     Tilt Bed    Modified Rankin (Stroke Patients Only)       Balance     Sitting balance-Leahy Scale: Good       Standing balance-Leahy Scale: Poor (approaching Fair)                              Communication    Cognition Arousal: Alert Behavior During Therapy: WFL for tasks assessed/performed   PT - Cognitive impairments: No apparent impairments                         Following commands: Intact      Cueing Cueing Techniques: Verbal cues, Gestural cues  Exercises      General Comments General comments (skin integrity, edema, etc.): No dizziness with longer upright activity      Pertinent Vitals/Pain Pain Assessment Pain Assessment: 0-10 Pain Score: 10-Worst pain ever Pain Location: scrotum, back, LLE Pain Descriptors / Indicators: Discomfort, Aching, Grimacing, Guarding Pain Intervention(s): Monitored during session, Patient requesting pain meds-RN notified    Home Living                          Prior Function            PT Goals (current goals can now be found in the care  plan section) Acute Rehab PT Goals Patient Stated Goal: less pain and dizziness so I can go home PT Goal Formulation: With patient Time For Goal Achievement: 08/21/23 Potential to Achieve Goals: Good Progress towards PT goals: Progressing toward goals    Frequency    Min 5X/week      PT Plan      Co-evaluation              AM-PAC PT "6 Clicks" Mobility   Outcome Measure  Help needed turning from your back to your side while in a flat bed without using bedrails?: A Little Help needed moving from lying on your back to sitting on the side of a flat bed without using bedrails?: A Little Help needed moving to and from a bed to a chair (including a wheelchair)?: A  Little Help needed standing up from a chair using your arms (e.g., wheelchair or bedside chair)?: A Little Help needed to walk in hospital room?: None Help needed climbing 3-5 steps with a railing? : A Little 6 Click Score: 19    End of Session Equipment Utilized During Treatment: Back brace Activity Tolerance: Patient tolerated treatment well Patient left: in chair;with call bell/phone within reach Nurse Communication: Mobility status;Patient requests pain meds PT Visit Diagnosis: Unsteadiness on feet (R26.81);Pain Pain - Right/Left: Left Pain - part of body:  (back and scrotal areas)     Time: 1610-9604 PT Time Calculation (min) (ACUTE ONLY): 28 min  Charges:    $Gait Training: 23-37 mins PT General Charges $$ ACUTE PT VISIT: 1 Visit                     Darcus Eastern, PT  Acute Rehabilitation Services Office [phone] Secure Chat welcomed    Marcial Setting 08/20/2023, 2:29 PM

## 2023-08-20 NOTE — Plan of Care (Signed)
  Problem: Activity: Goal: Risk for activity intolerance will decrease Outcome: Progressing   Problem: Elimination: Goal: Will not experience complications related to bowel motility Outcome: Progressing   Problem: Pain Managment: Goal: General experience of comfort will improve and/or be controlled Outcome: Progressing   Problem: Safety: Goal: Ability to remain free from injury will improve Outcome: Progressing

## 2023-08-21 LAB — CREATININE, SERUM
Creatinine, Ser: 1.18 mg/dL (ref 0.61–1.24)
GFR, Estimated: >60 mL/min (ref >60)

## 2023-08-21 NOTE — TOC Progression Note (Signed)
 Transition of Care [facility]) - Progression Note    Patient Details  Name: [name] MRN: [id number] Date of Birth: 1961-04-15  [facility] CM/SW Contact  Elspeth Hals, LCSW Phone Number: 08/21/2023, 8:27 AM  Clinical Narrative:   [name]/workers comp.  Discussed SNF process: she will send out the referral to her network and get bed offers.  She does have clinical from earlier today.  She requested PT note from this afternoon to be sent as well, which was done.  Per [name], pt walking 180', likely will not meet medical necessity for SNF approval.      Expected Discharge Plan: Skilled Nursing Facility Barriers to Discharge: Continued Medical Work up  Expected Discharge Plan and Services                         DME Arranged: Education officer, community spoke with at DME Agency: Workers Comp             Social Determinants of Health (SDOH) Interventions SDOH Screenings   Food Insecurity: No Food Insecurity (08/19/2023)  Housing: Low Risk  (08/19/2023)  Transportation Needs: No Transportation Needs (08/14/2023)  Utilities: Not At Risk (08/14/2023)  Alcohol Screen: Low Risk  (05/03/2017)  Social Connections: Unknown (09/12/2021)   Received from [facility]  Tobacco Use: Low Risk  (08/14/2023)    Readmission Risk Interventions     No data to display

## 2023-08-21 NOTE — Progress Notes (Signed)
 Physical Therapy Treatment Patient Details Name: [name] MRN: [id number] DOB: [birth year] Today's Date: 08/21/2023   History of Present Illness Pt is a 63 y/o male who presents s/p ALIF L4-L5  and L5-S1 on 4/14 and s/p PSIF L4-L5 and L5-S1 on 4/15. PMH significant for  HTN, GERD, achalasia.    PT Comments  Continuing work on functional mobility and activity tolerance; session focused on progressive ambulation, and considerations if skilled nursing for post, acute rehab is not an option for him; he moves slower this session, more painful this morning, but still managing over all well; supervision for Logue role and coming to sit, contact guard for sent to stand, and progressed to supervision with ambulation; he naturally pushes himself, and requested to walk further today; we also reviewed technique for stairs; overall, very nice progress    If plan is discharge home, recommend the following: A little help with walking and/or transfers;A little help with bathing/dressing/bathroom;Assistance with cooking/housework;Assist for transportation;Help with stairs or ramp for entrance   Can travel by private vehicle        Equipment Recommendations  Rolling walker (2 wheels)    Recommendations for Other Services       Precautions / Restrictions Precautions Precautions: Fall Recall of Precautions/Restrictions: Intact Precaution/Restrictions Comments: able to recall 3/3 back precautions Required Braces or Orthoses: Spinal Brace Spinal Brace: Lumbar corset;Applied in sitting position Restrictions Weight Bearing Restrictions Per Provider Order: No     Mobility  Bed Mobility Overal bed mobility: Needs Assistance   Rolling: Modified independent (Device/Increase time) Sidelying to sit: Supervision       General bed mobility comments: demonstrated good understanding of log rolling technique    Transfers Overall transfer level: Needs assistance Equipment used: Rolling walker (2  wheels) Transfers: Sit to/from Stand Sit to Stand: Supervision           General transfer comment: cues for hand placment and supervision to stand from EOB and recliner    Ambulation/Gait Ambulation/Gait assistance: Contact guard assist, Supervision Gait Distance (Feet): 200 Feet Assistive device: Rolling walker (2 wheels) Gait Pattern/deviations: Step-through pattern, Decreased stride length       General Gait Details: Decreased bil heel strike, pt frequently pausing, especially when speaking. Much better with avoiding twisting with turns today   Stairs Stairs: Yes Stairs assistance: Contact guard assist Stair Management: Two rails, Step to pattern, Forwards Number of Stairs: 5 General stair comments: Pt correctly described adn demonstrated technique for stairs with 2 rails   Wheelchair Mobility     Tilt Bed    Modified Rankin (Stroke Patients Only)       Balance     Sitting balance-Leahy Scale: Good       Standing balance-Leahy Scale: Poor (approaching Fair)                              Communication Communication Communication: No apparent difficulties  Cognition Arousal: Alert Behavior During Therapy: WFL for tasks assessed/performed   PT - Cognitive impairments: No apparent impairments                         Following commands: Intact      Cueing Cueing Techniques: Verbal cues, Gestural cues  Exercises      General Comments General comments (skin integrity, edema, etc.): No dizziness with longer upright activity      Pertinent Vitals/Pain Pain Assessment Pain Assessment: 0-10 Pain  Score: 8  Pain Location: scrotum, back, LLE Pain Descriptors / Indicators: Discomfort, Aching, Grimacing, Guarding Pain Intervention(s): Monitored during session    Home Living                          Prior Function            PT Goals (current goals can now be found in the care plan section) Acute Rehab PT  Goals Patient Stated Goal: tells me he would like to go to post-acute rehab if possible PT Goal Formulation: With patient Time For Goal Achievement: 08/21/23 Potential to Achieve Goals: Good Progress towards PT goals: Progressing toward goals (anticipate will meet goals soon)    Frequency    Min 5X/week      PT Plan      Co-evaluation              AM-PAC PT "6 Clicks" Mobility   Outcome Measure  Help needed turning from your back to your side while in a flat bed without using bedrails?: None Help needed moving from lying on your back to sitting on the side of a flat bed without using bedrails?: A Little Help needed moving to and from a bed to a chair (including a wheelchair)?: A Little Help needed standing up from a chair using your arms (e.g., wheelchair or bedside chair)?: A Little Help needed to walk in hospital room?: A Little Help needed climbing 3-5 steps with a railing? : A Little 6 Click Score: 19    End of Session Equipment Utilized During Treatment: Back brace Activity Tolerance: Patient tolerated treatment well Patient left: in chair;with call bell/phone within reach Nurse Communication: Mobility status;Patient requests pain meds PT Visit Diagnosis: Unsteadiness on feet (R26.81);Pain Pain - Right/Left: Left Pain - part of body:  (back and scrotal areas)     Time: 1610-9604 PT Time Calculation (min) (ACUTE ONLY): 34 min  Charges:                            Darcus Eastern, PT  Acute Rehabilitation Services Office [phone] Secure Chat welcomed    Marcial Setting 08/21/2023, 12:18 PM

## 2023-08-21 NOTE — Plan of Care (Signed)
   Problem: Elimination: Goal: Will not experience complications related to bowel motility Outcome: Progressing   Problem: Pain Managment: Goal: General experience of comfort will improve and/or be controlled Outcome: Progressing   Problem: Safety: Goal: Ability to remain free from injury will improve Outcome: Progressing

## 2023-08-21 NOTE — Progress Notes (Signed)
    Subjective: Procedure(s) (LRB): POSTERIOR LUMBAR FUSION 2 LEVEL (N/A) 7 Days Post-Op  Patient reports pain as moderate.  Reports decreased leg pain reports incisional back pain  and abdominal pain Positive void Positive bowel movement Positive flatus Negative chest pain or shortness of breath  Objective: Vital signs in last 24 hours: Temp:  [97.9 F (36.6 C)-98.5 F (36.9 C)] 98.1 F (36.7 C) (04/22 0517) Pulse Rate:  [71-99] 71 (04/22 0517) Resp:  [18] 18 (04/22 0517) BP: (111-141)/(81-86) 121/84 (04/22 0517) SpO2:  [94 %-95 %] 95 % (04/22 0517)  Intake/Output from previous day: No intake/output data recorded.  Labs: No results for input(s): "WBC", "RBC", "HCT", "PLT" in the last 72 hours. No results for input(s): "NA", "K", "CL", "CO2", "BUN", "CREATININE", "GLUCOSE", "CALCIUM " in the last 72 hours. No results for input(s): "LABPT", "INR" in the last 72 hours.  Physical Exam: Neurologically intact ABD soft Neurovascular intact Sensation intact distally Intact pulses distally Dorsiflexion/Plantar flexion intact Incision: dressing C/D/I Compartment soft Body mass index is 33.41 kg/m.   Assessment/Plan: Assessment: Tollie is a very pleasant 63 year old male who is postop day 8 from ALIF L4-L5 and L5-S1 and POD 7 from PSIF L4-L5 and L5-S1.   Overall, he is stable but does report incisional back and abdominal pain. Routine DVT doppler last week was negative for DVT. Plan is to D/C to SNF; we are awaiting bed placement. Patient has normal hemoglobin at this time.      Plan: - Continue incentive spirometry - Continue mobilization at least BID assisted with staff -Continue medical pain management  - Lovenox  for DVT prevention; patient education needed for injections  -Continue oral iron at this time - D/C [facility] SNF vs [facility]  Patient seen for Dr. Mort Ards, MD  Letty Raya PA-C [facility] [phone]

## 2023-08-22 NOTE — Progress Notes (Signed)
    Subjective: Procedure(s) (LRB): POSTERIOR LUMBAR FUSION 2 LEVEL (N/A) 8 Days Post-Op  Patient reports pain as moderate.  Reports decreased leg pain reports incisional back pain and abdominal pain and states that it has decreased from previous days  Positive void Positive bowel movement Positive flatus Negative chest pain or shortness of breath  Objective: Vital signs in last 24 hours: Temp:  [97.7 F (36.5 C)-98.5 F (36.9 C)] 98.4 F (36.9 C) (04/23 0419) Pulse Rate:  [75-97] 78 (04/23 0419) Resp:  [16-17] 16 (04/23 0419) BP: (120-147)/(85-103) 133/89 (04/23 0419) SpO2:  [95 %-98 %] 95 % (04/23 0419)  Intake/Output from previous day: 04/22 0701 - 04/23 0700 In: 720 [P.O.:720] Out: -   Labs: No results for input(s): "WBC", "RBC", "HCT", "PLT" in the last 72 hours. Recent Labs    08/21/23 0657  CREATININE 1.18   No results for input(s): "LABPT", "INR" in the last 72 hours.  Physical Exam: Neurologically intact ABD soft Neurovascular intact Sensation intact distally Intact pulses distally Dorsiflexion/Plantar flexion intact Incision: dressing C/D/I Compartment soft Body mass index is 33.41 kg/m.   Assessment/Plan: Assessment: [name] is a very pleasant 63 year old male who is postop day 9 from ALIF L4-L5 and L5-S1 and POD 8 from PSIF L4-L5 and L5-S1.   Overall, he is stable but does report incisional back and abdominal pain; he does state that his pain is decreasing when compared to prior days.  Routine DVT doppler last week was negative for DVT. Plan is to D/C to SNF; we are awaiting bed placement. Patient has normal hemoglobin at this time. If the patient wishes he can be D/C to home as he is making improvements with PT and ambulating.      Plan: - Continue incentive spirometry - Continue mobilization at least BID assisted with staff -Continue medical pain management  - Lovenox  for DVT prevention -Continue oral iron at this time - D/C to SNF vs home;  depending on patients comfort with ambulating and pain   Babette Stum Ardelle Kos PA-C for Mort Ards, MD [facility] [phone]

## 2023-08-22 NOTE — TOC Progression Note (Addendum)
 Transition of Care [facility]) - Progression Note    Patient Details  Name: [name] MRN: [id number] Date of Birth: 1960-05-19  Transition of Care [facility]) CM/SW Contact  Elspeth Hals, LCSW Phone Number: 08/22/2023, 10:26 AM  Clinical Narrative:   CSW left message wit workers comp case mgr Blima Bureau requesting update on SNF.   1140: CSW met with pt and workers comp case mgr Renee.  She reports she has been unable to locate SNF option for pt, would like to continue efforts today but if unsuccessful would have to discuss DC plan of home with home health.  Pt verbalized understanding.  1400: CSW spoke with [employer], [phone].  Stana Ear is requesting CSW send referral to the following facilities: [facility], [facility], [facility], [facility].  If pt receives offer, SNF can contact her for approval.  Referral sent.    PASSR received: [id number]  Expected Discharge Plan: Skilled Nursing Facility Barriers to Discharge: Continued Medical Work up  Expected Discharge Plan and Services                         DME Arranged: Otho Blitz rolling       Representative spoke with at DME Agency: Workers Comp             Social Determinants of Health (SDOH) Interventions SDOH Screenings   Food Insecurity: No Food Insecurity (08/19/2023)  Housing: Low Risk  (08/19/2023)  Transportation Needs: No Transportation Needs (08/14/2023)  Utilities: Not At Risk (08/14/2023)  Alcohol Screen: Low Risk  (05/03/2017)  Social Connections: Unknown (09/12/2021)   Received from [facility]  Tobacco Use: Low Risk  (08/14/2023)    Readmission Risk Interventions     No data to display

## 2023-08-22 NOTE — NC FL2 (Signed)
 [place]  MEDICAID [id number] LEVEL OF CARE FORM     IDENTIFICATION  Patient Name: [name] Birthdate: 1960-10-26 Sex: male Admission Date (Current Location): 08/13/2023  [facility] and [place] Number:  Producer, television/film/video and Address:  [facility], 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   Suicide attempt (HCC) 04/30/2017   Hypotension due to medication 04/30/2017   AKI (acute kidney injury) (HCC) 04/30/2017   Essential hypertension 08/22/2016   Chest pain 08/22/2016    Orientation RESPIRATION BLADDER Height & Weight     Self, Time, Situation, Place  Normal Continent Weight: 207 lb (93.9 kg) Height:  5\' 6"  (167.6 cm)  BEHAVIORAL SYMPTOMS/MOOD NEUROLOGICAL BOWEL NUTRITION STATUS      Continent Diet (see discharge summary)  AMBULATORY STATUS COMMUNICATION OF NEEDS Skin   Supervision Verbally Surgical wounds                       Personal Care Assistance Level of Assistance  Bathing, Feeding, Dressing Bathing Assistance: Independent Feeding assistance: Independent Dressing Assistance: Independent     Functional Limitations Info  Sight, Hearing, Speech Sight Info: Adequate Hearing Info: Adequate Speech Info: Adequate    SPECIAL CARE FACTORS FREQUENCY  PT (By licensed PT), OT (By licensed OT)     PT Frequency: 5x week OT  Frequency: 5x week            Contractures Contractures Info: Not present    Additional Factors Info  Code Status, Allergies Code Status Info: full Allergies Info: Hydrochlorothiazide , Lisinopril           Current Medications (08/22/2023):  This is the current hospital active medication list Current Facility-Administered Medications  Medication Dose Route Frequency Provider Last Rate Last Admin   acetaminophen  (TYLENOL ) tablet 650 mg  650 mg Oral Q4H PRN Mort Ards, MD   650 mg at 08/19/23 1615   Or   acetaminophen  (TYLENOL ) suppository 650 mg  650 mg Rectal Q4H PRN Mort Ards, MD       diazepam  (VALIUM ) tablet 10 mg  10 mg Oral Q8H PRN Mort Ards, MD   10 mg at 08/22/23 1008   enoxaparin  (LOVENOX ) injection 40 mg  40 mg Subcutaneous Q24H Mort Ards, MD   40 mg at 08/22/23 1416   ferrous sulfate  tablet 325 mg  325 mg Oral BID WC Ardelle Kos, Amber N, PA-C   325 mg at 08/22/23 1008   hydrochlorothiazide  (HYDRODIURIL ) tablet 25 mg  25 mg Oral Daily Mort Ards, MD   25 mg at 08/22/23 1008   magnesium  citrate solution 0.5 Bottle  0.5 Bottle Oral Once Brooks, Dahari, MD       menthol -cetylpyridinium (CEPACOL) lozenge 3 mg  1 lozenge Oral PRN Mort Ards, MD       Or   phenol (CHLORASEPTIC) mouth spray 1 spray  1  spray Mouth/Throat PRN Mort Ards, MD   1 spray at 08/15/23 1021   ondansetron  (ZOFRAN ) tablet 4 mg  4 mg Oral Q6H PRN Mort Ards, MD       Or   ondansetron  (ZOFRAN ) injection 4 mg  4 mg Intravenous Q6H PRN Mort Ards, MD       oxyCODONE  (Oxy IR/ROXICODONE ) immediate release tablet 10 mg  10 mg Oral Q3H PRN Mort Ards, MD   10 mg at 08/22/23 1416   oxyCODONE  (Oxy IR/ROXICODONE ) immediate release tablet 5 mg  5 mg Oral Q3H PRN Mort Ards, MD   5 mg at 08/17/23 0845   phenylephrine  (NEO-SYNEPHRINE) 1 % nasal drops 1 drop  1 drop Each Nare Q6H PRN Dallas Due, PA-C       polyethylene glycol (MIRALAX  / GLYCOLAX ) packet 17 g  17 g Oral Daily  PRN Mort Ards, MD   17 g at 08/19/23 0805   senna-docusate (Senokot-S) tablet 2 tablet  2 tablet Oral QHS Letty Raya N, PA-C   2 tablet at 08/21/23 2013   sodium chloride  flush (NS) 0.9 % injection 3 mL  3 mL Intravenous Q12H Mort Ards, MD   3 mL at 08/18/23 2025   sodium chloride  flush (NS) 0.9 % injection 3 mL  3 mL Intravenous PRN Mort Ards, MD         Discharge Medications: Please see discharge summary for a list of discharge medications.  Relevant Imaging Results:  Relevant Lab Results:   Additional Information SSN: [id number]  Elspeth Hals, LCSW

## 2023-08-22 NOTE — Progress Notes (Signed)
 Mobility Specialist Progress Note:    08/22/23 1200  Mobility  Activity Ambulated with assistance in hallway  Level of Assistance Standby assist, set-up cues, supervision of patient - no hands on  Assistive Device Front wheel walker  Distance Ambulated (ft) 300 ft  Activity Response Tolerated well  Mobility Referral Yes  Mobility visit 1 Mobility  Mobility Specialist Start Time (ACUTE ONLY) 1052  Mobility Specialist Stop Time (ACUTE ONLY) 1116  Mobility Specialist Time Calculation (min) (ACUTE ONLY) 24 min   Pt received in BR and agreeable. Required no physical assistance throughout. C/o some L thigh pain. Took x1 seated rest break. Returned to room w/o fault. Pt left in chair with call bell and all needs met.  [place]ante Nolon Baxter Mobility Specialist Please contact via Special educational needs teacher or Rehab office at [phone]

## 2023-08-23 MED ORDER — OXYCODONE HCL 10 MG PO TABS: 10.0000 mg | ORAL_TABLET | Freq: Three times a day (TID) | ORAL | 0 refills | Status: AC

## 2023-08-23 MED ORDER — ONDANSETRON HCL 4 MG PO TABS
4.0000 mg | ORAL_TABLET | Freq: Four times a day (QID) | ORAL | 0 refills | Status: AC | PRN
Start: 2023-08-23 — End: ?

## 2023-08-23 MED ORDER — ENOXAPARIN SODIUM 40 MG/0.4ML IJ SOSY: 40.0000 mg | PREFILLED_SYRINGE | INTRAMUSCULAR | 0 refills | Status: AC

## 2023-08-23 MED ORDER — DIAZEPAM 10 MG PO TABS: 10.0000 mg | ORAL_TABLET | Freq: Three times a day (TID) | ORAL | 0 refills | Status: AC | PRN

## 2023-08-23 NOTE — Progress Notes (Signed)
 Occupational Therapy Treatment Patient Details Name: [name] MRN: [id number] DOB: [birth year] Today's Date: 08/23/2023   History of present illness Pt is a 63 y/o male who presents s/p ALIF L4-L5  and L5-S1 on 4/14 and s/p PSIF L4-L5 and L5-S1 on 4/15. PMH significant for  HTN, GERD, achalasia.   OT comments  Pt making good progress with functional goals. Reviewed ADL A/E for LB selfcare education, ADL mobility using RW and donning back brace. Pt and his wife verbalize and demo understanding. Pt reports that he will no longer go to SNF for post acute rehab and will return home with [facility], TOC team aware      If plan is discharge home, recommend the following:  Assist for transportation;A little help with bathing/dressing/bathroom;A little help with walking and/or transfers   Equipment Recommendations  None recommended by OT    Recommendations for Other Services      Precautions / Restrictions Precautions Precautions: Fall Recall of Precautions/Restrictions: Intact Precaution/Restrictions Comments: able to recall 3/3 back precautions Required Braces or Orthoses: Spinal Brace Spinal Brace: Lumbar corset;Applied in sitting position Restrictions Weight Bearing Restrictions Per Provider Order: No       Mobility Bed Mobility               General bed mobility comments: pt in recliner    Transfers Overall transfer level: Needs assistance Equipment used: Rolling walker (2 wheels) Transfers: Sit to/from Stand Sit to Stand: Supervision     Step pivot transfers: Supervision     General transfer comment: cues for hand placment and supervision to stand from recliner and commode     Balance Overall balance assessment: Needs assistance Sitting-balance support: Feet supported Sitting balance-Leahy Scale: Good     Standing balance support: Single extremity supported, Bilateral upper extremity supported, No upper extremity supported, During functional activity Standing  balance-Leahy Scale: Fair Standing balance comment: able to stand at sink for self care tasks with no UE support                           ADL either performed or assessed with clinical judgement   ADL Overall ADL's : Needs assistance/impaired     Grooming: Wash/dry hands;Wash/dry face;Supervision/safety;With caregiver independent assisting   Upper Body Bathing: Sitting   Lower Body Bathing: Minimal assistance;Sit to/from stand;With caregiver independent assisting;With adaptive equipment Lower Body Bathing Details (indicate cue type and reason): has A/E, reviewed with pt     Lower Body Dressing: With adaptive equipment;Cueing for sequencing;Sitting/lateral leans;Minimal assistance Lower Body Dressing Details (indicate cue type and reason): has A/E, reviewed with pt Toilet Transfer: Ambulation;Rolling walker (2 wheels);Supervision/safety;Regular Toilet;Grab bars   Toileting- Clothing Manipulation and Hygiene: Supervision/safety              Extremity/Trunk Assessment Upper Extremity Assessment Upper Extremity Assessment: Overall WFL for tasks assessed   Lower Extremity Assessment Lower Extremity Assessment: Defer to PT evaluation   Cervical / Trunk Assessment Cervical / Trunk Assessment: Back Surgery    Vision Baseline Vision/History: 1 Wears glasses Ability to See in Adequate Light: 0 Adequate Patient Visual Report: No change from baseline     Perception     Praxis     Communication Communication Communication: No apparent difficulties   Cognition Arousal: Alert Behavior During Therapy: WFL for tasks assessed/performed Cognition: No apparent impairments  Following commands: Intact        Cueing      Exercises      Shoulder Instructions       General Comments      Pertinent Vitals/ Pain       Pain Assessment Pain Assessment: 0-10 Pain Score: 3  Pain Location: back Pain Descriptors /  Indicators: Aching, Discomfort Pain Intervention(s): Monitored during session, Repositioned, Premedicated before session  Home Living                                          Prior Functioning/Environment              Frequency  Min 2X/week        Progress Toward Goals  OT Goals(current goals can now be found in the care plan section)  Progress towards OT goals: Progressing toward goals     Plan      Co-evaluation                 AM-PAC OT "6 Clicks" Daily Activity     Outcome Measure   Help from another person eating meals?: None Help from another person taking care of personal grooming?: A Little Help from another person toileting, which includes using toliet, bedpan, or urinal?: A Little Help from another person bathing (including washing, rinsing, drying)?: A Little Help from another person to put on and taking off regular upper body clothing?: A Little Help from another person to put on and taking off regular lower body clothing?: A Little 6 Click Score: 19    End of Session Equipment Utilized During Treatment: Gait belt;Rolling walker (2 wheels);Other (comment) (ADL A/E)  OT Visit Diagnosis: Unsteadiness on feet (R26.81);Pain Pain - part of body:  (back)   Activity Tolerance Patient tolerated treatment well   Patient Left in chair;with call bell/phone within reach;with family/visitor present   Nurse Communication Mobility status        Time: 6045-4098 OT Time Calculation (min): 26 min  Charges: OT General Charges $OT Visit: 1 Visit OT Treatments $Self Care/Home Management : 8-22 mins $Therapeutic Activity: 8-22 mins    Alfred Ann 08/23/2023, 12:34 PM

## 2023-08-23 NOTE — Progress Notes (Signed)
 Patient discharge packet reviewed, patient being transported via private transportation, no iv access to be removed.

## 2023-08-23 NOTE — TOC Progression Note (Signed)
 Transition of Care [facility]) - Progression Note    Patient Details  Name: [name] MRN: [id number] Date of Birth: 10-01-1960  [facility]) CM/SW Contact  Elspeth Hals, LCSW Phone Number: 08/23/2023, 11:58 AM  Clinical Narrative:   TC [facility]: he will come to the hospital for FF meeting with pt, may be able to offer bed after that.  1145: Message Dylan: he can offer bed. CSW spoke with pt and wife Holland Lundborg, discussed SNF bed offer.  Medicare.gov ratings for [facility] provided.  Pt decided to decline SNF and DC home with [facility].  CSW informed by wife that [facility] staff member providing visitor checkin has been embarrassing pt wife "in front of the whole line of visitors" every day about her english accent.  Wife reports she speaks to this person in english but the person continues to tell her repeatedly that she cannot speak spanish.  Both pt and wife upset about this and asking to speak with someone about it.  [facility] leadership informed.   RNCM and workers comp rep informed of plan for [employer].    Expected Discharge Plan: Skilled Nursing Facility Barriers to Discharge: Continued Medical Work up  Expected Discharge Plan and Services                         DME Arranged: Education officer, community spoke with at DME Agency: Workers Comp             Social Determinants of Health (SDOH) Interventions SDOH Screenings   Food Insecurity: No Food Insecurity (08/19/2023)  Housing: Low Risk  (08/19/2023)  Transportation Needs: No Transportation Needs (08/14/2023)  Utilities: Not At Risk (08/14/2023)  Alcohol Screen: Low Risk  (05/03/2017)  Social Connections: Unknown (09/12/2021)   Received from [facility]  Tobacco Use: Low Risk  (08/14/2023)    Readmission Risk Interventions     No data to display

## 2023-08-23 NOTE — TOC Transition Note (Signed)
 Transition of Care [facility]) - Discharge Note   Patient Details  Name: [name] MRN: [id number] Date of Birth: [birth year]  Transition of Care [facility]) CM/SW Contact:  Alisa App, RN Phone Number: 08/23/2023, 3:21 PM   Clinical Narrative:    Patient will DC to: home Anticipated DC date: 08/23/2023 Family notified: yes Transport by: car   Per MD patient ready for DC today . RN, patient, patient's  wife and worker's comp. Liaison Rena Spemcer [phone]) aware of DC. Per provider pt without home health need. Pt needs to walk. F/U appointment noted on AVS, Pt to call office and arrange appointment for next Wednesday. Pt without RX med concerns. Wife to provide transportation to home.  RNCM will sign off for now as intervention is no longer needed. Please consult us  again if new needs arise.    Final next level of care: Home/Self Care Barriers to Discharge: Continued Medical Work up   Patient Goals and CMS Choice Patient states their goals for this hospitalization and ongoing recovery are:: to go home          Discharge Placement                       Discharge Plan and Services Additional resources added to the After Visit Summary for                  DME Arranged: Walker rolling       Representative spoke with at DME Agency: Workers Comp            Social Drivers of Health (SDOH) Interventions SDOH Screenings   Food Insecurity: No Food Insecurity (08/19/2023)  Housing: Low Risk  (08/19/2023)  Transportation Needs: No Transportation Needs (08/14/2023)  Utilities: Not At Risk (08/14/2023)  Alcohol Screen: Low Risk  (05/03/2017)  Social Connections: Unknown (09/12/2021)   Received from [facility]  Tobacco Use: Low Risk  (08/14/2023)     Readmission Risk Interventions     No data to display

## 2023-09-07 ENCOUNTER — Encounter (HOSPITAL_COMMUNITY): Payer: Self-pay | Admitting: Orthopedic Surgery
